# Patient Record
Sex: Male | Born: 1970 | Race: Black or African American | Hispanic: No | Marital: Married | Smoking: Never smoker
Health system: Southern US, Community
[De-identification: ages and names within clinical notes are randomized; demographics above are authoritative.]

## PROBLEM LIST (undated history)

## (undated) DIAGNOSIS — R519 Headache, unspecified: Secondary | ICD-10-CM

## (undated) DIAGNOSIS — R51 Headache: Secondary | ICD-10-CM

## (undated) HISTORY — PX: EYE SURGERY: SHX253

---

## 2006-09-13 ENCOUNTER — Emergency Department (HOSPITAL_COMMUNITY): Admission: EM | Admit: 2006-09-13 | Discharge: 2006-09-13 | Payer: Self-pay | Admitting: Emergency Medicine

## 2009-04-26 ENCOUNTER — Emergency Department (HOSPITAL_COMMUNITY): Admission: EM | Admit: 2009-04-26 | Discharge: 2009-04-27 | Payer: Self-pay | Admitting: Emergency Medicine

## 2010-09-21 LAB — DIFFERENTIAL
Basophils Absolute: 0.1 10*3/uL (ref 0.0–0.1)
Basophils Relative: 1 % (ref 0–1)
Eosinophils Absolute: 2.3 10*3/uL — ABNORMAL HIGH (ref 0.0–0.7)
Monocytes Relative: 5 % (ref 3–12)
Neutrophils Relative %: 34 % — ABNORMAL LOW (ref 43–77)

## 2010-09-21 LAB — CBC
MCHC: 34 g/dL (ref 30.0–36.0)
MCV: 91 fL (ref 78.0–100.0)
RBC: 4.09 MIL/uL — ABNORMAL LOW (ref 4.22–5.81)
RDW: 13.3 % (ref 11.5–15.5)

## 2017-01-06 ENCOUNTER — Emergency Department (HOSPITAL_COMMUNITY)
Admission: EM | Admit: 2017-01-06 | Discharge: 2017-01-06 | Disposition: A | Discharge status: Home / Self Care | Payer: Self-pay | Attending: Emergency Medicine | Admitting: Emergency Medicine

## 2017-01-06 ENCOUNTER — Encounter (HOSPITAL_COMMUNITY): Payer: Self-pay

## 2017-01-06 DIAGNOSIS — M2550 Pain in unspecified joint: Secondary | ICD-10-CM

## 2017-01-06 DIAGNOSIS — M130 Polyarthritis, unspecified: Secondary | ICD-10-CM | POA: Insufficient documentation

## 2017-01-06 MED ORDER — PREDNISONE 20 MG PO TABS: ORAL_TABLET | ORAL | 0 refills | Status: DC

## 2017-01-06 MED ORDER — KETOROLAC TROMETHAMINE 30 MG/ML IJ SOLN
30.0000 mg | Freq: Once | INTRAMUSCULAR | Status: AC
  Administered 2017-01-06: 30 mg via INTRAMUSCULAR
  Filled 2017-01-06: qty 1

## 2017-01-06 MED ORDER — COLCHICINE 0.6 MG PO TABS: 0.6000 mg | ORAL_TABLET | Freq: Every day | ORAL | 0 refills | Status: DC

## 2017-01-06 NOTE — Discharge Instructions (Signed)
Please take medications prescribed along with Naproxen for your pain.  Follow up with your primary care provider or orthopedist if your conditions are not well controlled.

## 2017-01-06 NOTE — ED Triage Notes (Signed)
Patient complains of bilateral knee pain with ambulation and swelling to right wrist x 3 days. States seen in [place] and taking naprosyn as prescribed with some relief, denies injury, NAD

## 2017-01-06 NOTE — ED Notes (Signed)
Pt here for c/o right knee pain,saw MD in [place] this week. Started last pm with "knots" on right leg and right wrist.

## 2017-01-06 NOTE — ED Provider Notes (Signed)
[facility]-EMERGENCY DEPT Provider Note   CSN: [id number] Arrival date & time: 01/06/17  1231     History   Chief Complaint Chief Complaint  Patient presents with  . knee pain/wrist swelling    HPI Bryan Reilly is a 46 y.o. male.  HPI   46 year old male presenting complaining of multiple joint pain. Patient states for the past 2 weeks he has had intermittent pain to his right knee primarily to the back the knee and radiates towards his right calf. Pain is described as a dull sensation more noticeable at nighttime when he rest. Pain is mild to moderate. He went to a local ER 2 days ago for this complaint. He had an x-ray of his right knee and was told that he may need an MRI for further evaluation because the x-ray did not show anything. He was prescribed naproxen. he took naproxen which provide some relief. However, since yesterday he now noticing similar pain to his left knee, and right wrist. Pain worsening with movement. He felt the area is swollen as well. He denies fever, chills, vision changes, dysuria, penile discharge, or rash. He denies any recent strenuous activities or injury. No history of gout. No history of autoimmune disease. No history of cancer. No prior injury to the same area. No history of STD.  History reviewed. No pertinent past medical history.  There are no active problems to display for this patient.   History reviewed. No pertinent surgical history.     Home Medications    Prior to Admission medications   Not on File    Family History No family history on file.  Social History Social History  Substance Use Topics  . Smoking status: Never Smoker  . Smokeless tobacco: Never Used  . Alcohol use Not on file     Allergies   Patient has no known allergies.   Review of Systems Review of Systems  Constitutional: Negative for fever.  Musculoskeletal: Positive for arthralgias.  Skin: Negative for rash and wound.  Neurological: Negative for  numbness.     Physical Exam Updated Vital Signs BP 121/71 (BP Location: Left Arm)   Pulse 80   Temp 98.3 F (36.8 C) (Oral)   Resp 18   SpO2 99%   Physical Exam  Constitutional: He appears well-developed and well-nourished. No distress.  Patient is well-appearing no acute discomfort  HENT:  Head: Atraumatic.  Eyes: Conjunctivae are normal.  Neck: Neck supple.  Cardiovascular: Intact distal pulses.   Musculoskeletal: He exhibits tenderness (Right wrist: Tenderness to all aspects with decreased wrist range of motion but normal appearance. Right knee tenderness to posterior knee with normal knee flexion extension. Left knee: Tenderness to anterior knee with decreased flexion).  Neurological: He is alert.  Patellar deep tendon reflex intact bilaterally, no foot drops, 5 out 5 strength to bilateral lower extremities  Skin: No rash noted.  Psychiatric: He has a normal mood and affect.  Nursing note and vitals reviewed.    ED Treatments / Results  Labs (all labs ordered are listed, but only abnormal results are displayed) Labs Reviewed - No data to display  EKG  EKG Interpretation None       Radiology No results found.  Procedures Procedures (including critical care time)  Medications Ordered in ED Medications - No data to display   Initial Impression / Assessment and Plan / ED Course  I have reviewed the triage vital signs and the nursing notes.  Pertinent labs & imaging results that  were available during my care of the patient were reviewed by me and considered in my medical decision making (see chart for details).     BP 121/71 (BP Location: Left Arm)   Pulse 80   Temp 98.3 F (36.8 C) (Oral)   Resp 18   SpO2 99%    Final Clinical Impressions(s) / ED Diagnoses   Final diagnoses:  Polyarthralgia    New Prescriptions New Prescriptions   COLCHICINE 0.6 MG TABLET    Take 1 tablet (0.6 mg total) by mouth daily.   PREDNISONE (DELTASONE) 20 MG TABLET     3 tabs po day one, then 2 tabs daily x 4 days   1:10 PM Patient here with polyarthralgias involving right wrist, bilateral knees.  On exam, patient does not appear to have any significant signs of injury warranting x-ray. No surrounding skin erythematous yes cellulitis. Low suspicion for septic joint. I have low suspicion for disseminated gonococcal arthralgia. Question gout versus rheumatological condition. Doubt DVT. Able to cannulate without difficulty, he is neurovascularly intact. At this time, I will prescribe patient prednisone and Colchicine. Ortho referral given.  Return precaution discussed.     Fayrene Helper, PA-C 01/06/17 1314    Arby Barrette, MD 01/06/17 [phone]

## 2017-01-09 ENCOUNTER — Ambulatory Visit
Admission: RE | Admit: 2017-01-09 | Discharge: 2017-01-09 | Disposition: A | Discharge status: Home / Self Care | Payer: PRIVATE HEALTH INSURANCE | Source: Ambulatory Visit | Attending: Family Medicine | Admitting: Family Medicine

## 2017-01-09 ENCOUNTER — Other Ambulatory Visit: Payer: Self-pay | Admitting: Family Medicine

## 2017-01-09 DIAGNOSIS — M25532 Pain in left wrist: Secondary | ICD-10-CM

## 2017-01-09 DIAGNOSIS — M25562 Pain in left knee: Secondary | ICD-10-CM

## 2017-03-05 ENCOUNTER — Other Ambulatory Visit: Payer: Self-pay | Admitting: Sports Medicine

## 2017-03-05 DIAGNOSIS — M25512 Pain in left shoulder: Secondary | ICD-10-CM

## 2017-03-16 ENCOUNTER — Ambulatory Visit
Admission: RE | Admit: 2017-03-16 | Discharge: 2017-03-16 | Disposition: A | Discharge status: Home / Self Care | Payer: PRIVATE HEALTH INSURANCE | Source: Ambulatory Visit | Attending: Sports Medicine | Admitting: Sports Medicine

## 2017-03-16 DIAGNOSIS — M25512 Pain in left shoulder: Secondary | ICD-10-CM

## 2017-08-26 DIAGNOSIS — J0101 Acute recurrent maxillary sinusitis: Secondary | ICD-10-CM | POA: Diagnosis not present

## 2017-08-26 DIAGNOSIS — J339 Nasal polyp, unspecified: Secondary | ICD-10-CM | POA: Diagnosis not present

## 2017-08-26 DIAGNOSIS — R51 Headache: Secondary | ICD-10-CM | POA: Diagnosis present

## 2017-08-27 ENCOUNTER — Emergency Department (HOSPITAL_COMMUNITY): Payer: PRIVATE HEALTH INSURANCE

## 2017-08-27 ENCOUNTER — Encounter (HOSPITAL_COMMUNITY): Payer: Self-pay

## 2017-08-27 ENCOUNTER — Emergency Department (HOSPITAL_COMMUNITY)
Admission: EM | Admit: 2017-08-27 | Discharge: 2017-08-27 | Disposition: A | Discharge status: Home / Self Care | Payer: PRIVATE HEALTH INSURANCE | Attending: Emergency Medicine | Admitting: Emergency Medicine

## 2017-08-27 DIAGNOSIS — J0101 Acute recurrent maxillary sinusitis: Secondary | ICD-10-CM

## 2017-08-27 DIAGNOSIS — J339 Nasal polyp, unspecified: Secondary | ICD-10-CM

## 2017-08-27 MED ORDER — MOXIFLOXACIN HCL 400 MG PO TABS: 400.0000 mg | ORAL_TABLET | Freq: Every day | ORAL | 0 refills | Status: DC

## 2017-08-27 MED ORDER — ACETAMINOPHEN 500 MG PO TABS
1000.0000 mg | ORAL_TABLET | Freq: Once | ORAL | Status: AC
  Administered 2017-08-27: 1000 mg via ORAL
  Filled 2017-08-27: qty 2

## 2017-08-27 MED ORDER — HYDROCODONE-ACETAMINOPHEN 5-325 MG PO TABS: ORAL_TABLET | ORAL | 0 refills | Status: DC

## 2017-08-27 NOTE — Discharge Instructions (Signed)
You can use Flonase nasal spray which is avilable over the counter. Use nasal saline (you can try Arm and Hammer Simply Saline) at least 4 times a day, use saline 5-10 minutes before using the fluticasone (flonase) nasal spray ° °Do not use Afrin (Oxymetazoline) ° °Rest, wash hands frequently  and drink plenty of water. ° °You may try over the counter medication such as allegra-decongestant or claritin-decongestant and/or Mucinex or decongestants. ° °Push fluids to try to thin the mucus and get it to flow out the nose.  ° °Please follow with your primary care doctor in the next 2 days for a check-up. They must obtain records for further management.  ° °Do not hesitate to return to the Emergency Department for any new, worsening or concerning symptoms.  ° °

## 2017-08-27 NOTE — ED Notes (Signed)
Patient transported to CT 

## 2017-08-27 NOTE — ED Triage Notes (Signed)
Pt complains of recurrent sinus infections, he has had antibiotics and nasal sprays, he now complains of pain behind his eyes and through his head, he says its worse at night

## 2017-08-27 NOTE — ED Provider Notes (Signed)
[facility]-EMERGENCY DEPT Provider Note   CSN: [id number] Arrival date & time: 08/26/17  2355     History   Chief Complaint Chief Complaint  Patient presents with  . Recurrent Sinusitis    HPI   Blood pressure 126/82, pulse 60, temperature 97.9 F (36.6 C), temperature source Oral, resp. rate 20, SpO2 99 %.  Bryan Reilly is a 47 y.o. male complaining of right temporal pain worsening over the course of the last several days.  He states he is being treated for a sinusitis with amoxicillin he is finished 8 of the 10 days.  He states he has been on 3 courses of antibiotics in the last 4 months.  Afebrile but initially before starting antibiotics he states that he did have fever he denies any change in vision, nausea, vomiting, numbness, weakness.  He is taking ibuprofen at home with little relief.  History reviewed. No pertinent past medical history.  There are no active problems to display for this patient.   History reviewed. No pertinent surgical history.     Home Medications    Prior to Admission medications   Medication Sig Start Date End Date Taking? Authorizing Provider  colchicine 0.6 MG tablet Take 1 tablet (0.6 mg total) by mouth daily. 01/06/17   Fayrene Helperran, [place], PA-C  HYDROcodone-acetaminophen (NORCO/VICODIN) 5-325 MG tablet Take 1-2 tablets by mouth every 6 hours as needed for pain. 08/27/17   Rein Popov, Joni ReiningNicole, PA-C  moxifloxacin (AVELOX) 400 MG tablet Take 1 tablet (400 mg total) by mouth daily. 08/27/17   Jordyn Doane, Joni ReiningNicole, PA-C  predniSONE (DELTASONE) 20 MG tablet 3 tabs po day one, then 2 tabs daily x 4 days 01/06/17   Fayrene Helperran, [place], PA-C    Family History History reviewed. No pertinent family history.  Social History Social History   Tobacco Use  . Smoking status: Never Smoker  . Smokeless tobacco: Never Used  Substance Use Topics  . Alcohol use: No    Frequency: Never  . Drug use: No     Allergies   Patient has no known  allergies.   Review of Systems Review of Systems  A complete review of systems was obtained and all systems are negative except as noted in the HPI and PMH.    Physical Exam Updated Vital Signs BP 134/88 (BP Location: Right Arm)   Pulse 64   Temp 97.9 F (36.6 C) (Oral)   Resp 15   SpO2 98%   Physical Exam  Constitutional: He appears well-developed and well-nourished.  HENT:  Head: Normocephalic.  Right Ear: External ear normal.  Left Ear: External ear normal.  Mouth/Throat: Oropharynx is clear and moist. No oropharyngeal exudate.  No drooling or stridor. Posterior pharynx mildly erythematous no significant tonsillar hypertrophy. No exudate. Soft palate rises symmetrically. No TTP or induration under tongue.   No tenderness to palpation of frontal or bilateral maxillary sinuses.  Mild mucosal edema in the nares with scant rhinorrhea.  Bilateral tympanic membranes with normal architecture and good light reflex.    Eyes: Conjunctivae and EOM are normal. Pupils are equal, round, and reactive to light.  Neck: Normal range of motion. Neck supple.  Cardiovascular: Normal rate and regular rhythm.  Pulmonary/Chest: Effort normal and breath sounds normal. No stridor. No respiratory distress. He has no wheezes. He has no rales. He exhibits no tenderness.  Abdominal: Soft. There is no tenderness. There is no rebound and no guarding.  Nursing note and vitals reviewed.    ED Treatments /  Results  Labs (all labs ordered are listed, but only abnormal results are displayed) Labs Reviewed - No data to display  EKG  EKG Interpretation None       Radiology Ct Head Wo Contrast  Result Date: 08/27/2017 CLINICAL DATA:  Acute onset of pain behind the eyes. Headache. Recurrent sinus infection. EXAM: CT HEAD WITHOUT CONTRAST TECHNIQUE: Contiguous axial images were obtained from the base of the skull through the vertex without intravenous contrast. COMPARISON:  None. FINDINGS: Brain:  No evidence of acute infarction, hemorrhage, hydrocephalus, extra-axial collection or mass lesion/mass effect. The posterior fossa, including the cerebellum, brainstem and fourth ventricle, is within normal limits. The third and lateral ventricles, and basal ganglia are unremarkable in appearance. The cerebral hemispheres are symmetric in appearance, with normal gray-white differentiation. No mass effect or midline shift is seen. Vascular: No hyperdense vessel or unexpected calcification. Skull: There is no evidence of fracture; visualized osseous structures are unremarkable in appearance. Sinuses/Orbits: The visualized portions of the orbits are within normal limits. There is complete opacification of the left maxillary sinus, left frontal sinus and left ethmoid air cells. A large 5 cm polyp or mucus retention cyst is noted extending through the medial wall of the left maxillary sinus. There is mucoperiosteal thickening at the left maxillary sinus. The remaining paranasal sinuses and mastoid air cells are well-aerated. Other: No significant soft tissue abnormalities are seen. IMPRESSION: 1. No acute intracranial pathology seen on CT. 2. Large 5 cm polyp or mucus retention cyst noted extending through the medial wall of the left maxillary sinus, with associated mucoperiosteal thickening. Complete opacification of the left maxillary sinus, left frontal sinus and left ethmoid air cells. Electronically Signed   By: Roanna Raider M.D.   On: 08/27/2017 03:25    Procedures Procedures (including critical care time)  Medications Ordered in ED Medications  acetaminophen (TYLENOL) tablet 1,000 mg (1,000 mg Oral Given 08/27/17 0246)     Initial Impression / Assessment and Plan / ED Course  I have reviewed the triage vital signs and the nursing notes.  Pertinent labs & imaging results that were available during my care of the patient were reviewed by me and considered in my medical decision making (see chart for  details).     Vitals:   08/27/17 0031 08/27/17 0404  BP: 126/82 134/88  Pulse: 60 64  Resp: 20 15  Temp: 97.9 F (36.6 C)   TempSrc: Oral   SpO2: 99% 98%    Medications  acetaminophen (TYLENOL) tablet 1,000 mg (1,000 mg Oral Given 08/27/17 0246)    Bryan Reilly is 47 y.o. male presenting with recurrent sinusitis.  CT with polyp and opacification of the maxillary sinus, I am taking him off of the amoxicillin and will start him on Levaquin, ENT referral given as an outpatient, Vicodin for pain control at home.  Evaluation does not show pathology that would require ongoing emergent intervention or inpatient treatment. Pt is hemodynamically stable and mentating appropriately. Discussed findings and plan with patient/guardian, who agrees with care plan. All questions answered. Return precautions discussed and outpatient follow up given.      Final Clinical Impressions(s) / ED Diagnoses   Final diagnoses:  Nasal polyp  Acute recurrent maxillary sinusitis    ED Discharge Orders        Ordered    moxifloxacin (AVELOX) 400 MG tablet  Daily     08/27/17 0359    HYDROcodone-acetaminophen (NORCO/VICODIN) 5-325 MG tablet     08/27/17 0359  Kaylyn Lim 08/27/17 0411    Molpus, Jonny Ruiz, MD 08/27/17 [phone]

## 2017-10-08 ENCOUNTER — Ambulatory Visit (INDEPENDENT_AMBULATORY_CARE_PROVIDER_SITE_OTHER): Payer: PRIVATE HEALTH INSURANCE | Admitting: Otolaryngology

## 2017-10-08 DIAGNOSIS — J322 Chronic ethmoidal sinusitis: Secondary | ICD-10-CM

## 2017-10-08 DIAGNOSIS — J310 Chronic rhinitis: Secondary | ICD-10-CM | POA: Diagnosis not present

## 2017-10-08 DIAGNOSIS — J320 Chronic maxillary sinusitis: Secondary | ICD-10-CM | POA: Diagnosis not present

## 2017-10-18 ENCOUNTER — Other Ambulatory Visit (INDEPENDENT_AMBULATORY_CARE_PROVIDER_SITE_OTHER): Payer: Self-pay | Admitting: Otolaryngology

## 2017-10-18 DIAGNOSIS — J329 Chronic sinusitis, unspecified: Secondary | ICD-10-CM

## 2017-10-24 ENCOUNTER — Ambulatory Visit (HOSPITAL_COMMUNITY)
Admission: RE | Admit: 2017-10-24 | Discharge: 2017-10-24 | Disposition: A | Discharge status: Home / Self Care | Payer: PRIVATE HEALTH INSURANCE | Source: Ambulatory Visit | Attending: Otolaryngology | Admitting: Otolaryngology

## 2017-10-24 DIAGNOSIS — J329 Chronic sinusitis, unspecified: Secondary | ICD-10-CM

## 2017-10-24 DIAGNOSIS — J320 Chronic maxillary sinusitis: Secondary | ICD-10-CM | POA: Diagnosis present

## 2017-10-27 ENCOUNTER — Encounter (HOSPITAL_COMMUNITY): Payer: Self-pay | Admitting: Emergency Medicine

## 2017-10-27 ENCOUNTER — Emergency Department (HOSPITAL_COMMUNITY): Payer: PRIVATE HEALTH INSURANCE

## 2017-10-27 ENCOUNTER — Other Ambulatory Visit: Payer: Self-pay

## 2017-10-27 ENCOUNTER — Emergency Department (HOSPITAL_COMMUNITY)
Admission: EM | Admit: 2017-10-27 | Discharge: 2017-10-27 | Disposition: A | Discharge status: Home / Self Care | Payer: PRIVATE HEALTH INSURANCE | Attending: Emergency Medicine | Admitting: Emergency Medicine

## 2017-10-27 DIAGNOSIS — R079 Chest pain, unspecified: Secondary | ICD-10-CM | POA: Diagnosis present

## 2017-10-27 LAB — BASIC METABOLIC PANEL
ANION GAP: 8 (ref 5–15)
BUN: 17 mg/dL (ref 6–20)
CHLORIDE: 108 mmol/L (ref 101–111)
CO2: 26 mmol/L (ref 22–32)
Calcium: 8.4 mg/dL — ABNORMAL LOW (ref 8.9–10.3)
Creatinine, Ser: 1.13 mg/dL (ref 0.61–1.24)
GFR calc non Af Amer: >60 mL/min (ref >60)
GLUCOSE: 96 mg/dL (ref 65–99)
POTASSIUM: 4.1 mmol/L (ref 3.5–5.1)
SODIUM: 142 mmol/L (ref 135–145)

## 2017-10-27 LAB — CBC
HEMATOCRIT: 40.3 % (ref 39.0–52.0)
Hemoglobin: 12.4 g/dL — ABNORMAL LOW (ref 13.0–17.0)
MCH: 28.7 pg (ref 26.0–34.0)
MCHC: 30.8 g/dL (ref 30.0–36.0)
MCV: 93.3 fL (ref 78.0–100.0)
Platelets: 178 10*3/uL (ref 150–400)
RBC: 4.32 MIL/uL (ref 4.22–5.81)
RDW: 13.4 % (ref 11.5–15.5)
WBC: 4.3 10*3/uL (ref 4.0–10.5)

## 2017-10-27 LAB — D-DIMER, QUANTITATIVE (NOT AT ARMC): D DIMER QUANT: 0.33 ug{FEU}/mL (ref 0.00–0.50)

## 2017-10-27 LAB — I-STAT TROPONIN, ED: Troponin i, poc: 0 ng/mL (ref 0.00–0.08)

## 2017-10-27 MED ORDER — KETOROLAC TROMETHAMINE 30 MG/ML IJ SOLN
30.0000 mg | Freq: Once | INTRAMUSCULAR | Status: AC
  Administered 2017-10-27: 30 mg via INTRAVENOUS
  Filled 2017-10-27: qty 1

## 2017-10-27 NOTE — ED Notes (Signed)
Blue top sent to the lab to hold.

## 2017-10-27 NOTE — ED Provider Notes (Signed)
Emergency Department Provider Note   I have reviewed the triage vital signs and the nursing notes.   HISTORY  Chief Complaint Chest Pain   HPI Bryan Reilly is a 47 y.o. male without significant past medical history the presents the emergency department today secondary to chest pain.  Patient states that Thursday he started feeling some left-sided sharp chest pain but this slowly improved.  Throughout Friday he started having pain was more central and today is more on the right side.  States is worse when he takes a deep breath and if he coughs.  He had some cough yesterday but was not productive.  No fevers or chills.  No leg swelling, recent surgeries or long car rides.  No history of blood clots.  No history of cardiac issues.  Does not smoke cigarettes.  No sick contacts.  Does have a history of rheumatoid arthritis for which he takes ibuprofen, turmeric and folic acid. No other associated or modifying symptoms.    History reviewed. No pertinent past medical history.  There are no active problems to display for this patient.   History reviewed. No pertinent surgical history.  Current Outpatient Rx  . Order #: [id number] Class: Historical Med  . Order #: [id number] Class: Historical Med  . Order #: [id number] Class: Historical Med  . Order #: [id number] Class: Historical Med    Allergies Patient has no known allergies.  History reviewed. No pertinent family history.  Social History Social History   Tobacco Use  . Smoking status: Never Smoker  . Smokeless tobacco: Never Used  Substance Use Topics  . Alcohol use: No    Frequency: Never  . Drug use: No    Review of Systems  All other systems negative except as documented in the HPI. All pertinent positives and negatives as reviewed in the HPI. ____________________________________________   PHYSICAL EXAM:  VITAL SIGNS: ED Triage Vitals  Enc Vitals Group     BP 10/27/17 0631 (!) 142/72     Pulse Rate 10/27/17 0631  60     Resp 10/27/17 0631 10     Temp 10/27/17 0631 97.8 F (36.6 C)     Temp Source 10/27/17 0631 Oral     SpO2 10/27/17 0631 100 %     Weight 10/27/17 0632 195 lb (88.5 kg)     Height 10/27/17 0632  (1.93 m)    Constitutional: Alert and oriented. Well appearing and in no acute distress. Eyes: Conjunctivae are normal. PERRL. EOMI. Head: Atraumatic. Nose: No congestion/rhinnorhea. Mouth/Throat: Mucous membranes are moist.  Oropharynx non-erythematous. Neck: No stridor.  No meningeal signs.   Cardiovascular: Normal rate, regular rhythm. Good peripheral circulation. Grossly normal heart sounds.   Respiratory: Normal respiratory effort.  No retractions. Lungs CTAB. Gastrointestinal: Soft and nontender. No distention.  Musculoskeletal: No lower extremity tenderness nor edema. No gross deformities of extremities. Neurologic:  Normal speech and language. No gross focal neurologic deficits are appreciated.  Skin:  Skin is warm, dry and intact. No rash noted.   ____________________________________________   LABS (all labs ordered are listed, but only abnormal results are displayed)  Labs Reviewed  BASIC METABOLIC PANEL - Abnormal; Notable for the following components:      Result Value   Calcium 8.4 (*)    All other components within normal limits  CBC - Abnormal; Notable for the following components:   Hemoglobin 12.4 (*)    All other components within normal limits  D-DIMER, QUANTITATIVE (NOT AT [facility])  I-STAT TROPONIN, ED  ____________________________________________  EKG   EKG Interpretation  Date/Time:  Saturday Oct 27 2017 06:29:43 EDT Ventricular Rate:  56 PR Interval:    QRS Duration: 109 QT Interval:  405 QTC Calculation: 391 R Axis:   -30 Text Interpretation:  Sinus rhythm Atrial premature complexes Left axis deviation Abnormal R-wave progression, early transition Minimal ST elevation, anterior leads ST changes similar to previous in march 2008 Confirmed by  Marily Memos [phone]) on 10/27/2017 8:01:37 AM       ____________________________________________  RADIOLOGY  Dg Chest 2 View  Result Date: 10/27/2017 CLINICAL DATA:  Right-sided chest pain today with shortness of breath. EXAM: CHEST - 2 VIEW COMPARISON:  09/12/2016 FINDINGS: Heart size is normal. Mediastinal shadows are normal. The lungs are clear. No bronchial thickening. No infiltrate, mass, effusion or collapse. Pulmonary vascularity is normal. No bony abnormality. IMPRESSION: Normal chest Electronically Signed   By: Paulina Fusi M.D.   On: 10/27/2017 07:10    ____________________________________________   INITIAL IMPRESSION / ASSESSMENT AND PLAN / ED COURSE  Atypical story for ACS, doubt that. Possibly PE? Will check d dimer. ecg ok, 3 days of symptoms, negative troponin, unlikely primary cardiac. Cough yesterday, but nonproductive, no fever or chills and clear CXR, doubt pneumonia.  Possibly MSK? Will treat with NSAIDs pending labs.   Labs unremarkable.  D-dimer low making pulmonary embolus unlikely.  Suspect likely costochondritis as a cause of his symptoms.  Will treat with NSAIDs and PCP follow-up as needed.   Pertinent labs & imaging results that were available during my care of the patient were reviewed by me and considered in my medical decision making (see chart for details).  ____________________________________________  FINAL CLINICAL IMPRESSION(S) / ED DIAGNOSES  Final diagnoses:  Nonspecific chest pain     MEDICATIONS GIVEN DURING THIS VISIT:  Medications  ketorolac (TORADOL) 30 MG/ML injection 30 mg (30 mg Intravenous Given 10/27/17 0847)     NEW OUTPATIENT MEDICATIONS STARTED DURING THIS VISIT:  Current Discharge Medication List      Note:  This note was prepared with assistance of Dragon voice recognition software. Occasional wrong-word or sound-a-like substitutions may have occurred due to the inherent limitations of voice recognition software.     Marily Memos, MD 10/27/17 1051

## 2017-10-27 NOTE — ED Triage Notes (Signed)
Patient complaining of right chest pain today. Patient states it started this morning. Patient states he is sob and tired.

## 2017-10-27 NOTE — ED Notes (Signed)
Patient transported to X-ray 

## 2017-10-29 ENCOUNTER — Ambulatory Visit (INDEPENDENT_AMBULATORY_CARE_PROVIDER_SITE_OTHER): Payer: PRIVATE HEALTH INSURANCE | Admitting: Otolaryngology

## 2017-10-29 DIAGNOSIS — J321 Chronic frontal sinusitis: Secondary | ICD-10-CM

## 2017-10-29 DIAGNOSIS — J320 Chronic maxillary sinusitis: Secondary | ICD-10-CM

## 2017-10-29 DIAGNOSIS — J322 Chronic ethmoidal sinusitis: Secondary | ICD-10-CM

## 2017-11-06 ENCOUNTER — Ambulatory Visit (HOSPITAL_COMMUNITY): Payer: PRIVATE HEALTH INSURANCE

## 2017-11-30 ENCOUNTER — Ambulatory Visit (INDEPENDENT_AMBULATORY_CARE_PROVIDER_SITE_OTHER): Payer: PRIVATE HEALTH INSURANCE | Admitting: Neurology

## 2017-11-30 ENCOUNTER — Other Ambulatory Visit: Payer: Self-pay

## 2017-11-30 ENCOUNTER — Encounter: Payer: Self-pay | Admitting: Neurology

## 2017-11-30 VITALS — BP 106/60 | HR 83 | Ht 76.0 in | Wt 181.0 lb

## 2017-11-30 DIAGNOSIS — R51 Headache: Secondary | ICD-10-CM | POA: Diagnosis not present

## 2017-11-30 DIAGNOSIS — R202 Paresthesia of skin: Secondary | ICD-10-CM

## 2017-11-30 DIAGNOSIS — R519 Headache, unspecified: Secondary | ICD-10-CM

## 2017-11-30 MED ORDER — NORTRIPTYLINE HCL 25 MG PO CAPS: 25.0000 mg | ORAL_CAPSULE | Freq: Every day | ORAL | 3 refills | Status: DC

## 2017-11-30 NOTE — Patient Instructions (Addendum)
  1.  Start nortriptyline 25mg  at bedtime.  Call in 4 weeks with update and we can adjust dose if needed. 2.  Stop butalbital.  Instead, take Excedrin Migraine. 3.  Limit use of pain relievers to no more than 2 days out of the week.  These medications include acetaminophen, ibuprofen, triptans and narcotics.  This will help reduce risk of rebound headaches. 4.  Be aware of common food triggers such as processed sweets, processed foods with nitrites (such as deli meat, hot dogs, sausages), foods with MSG, alcohol (such as wine), chocolate, certain cheeses, certain fruits (dried fruits, bananas, some citrus fruit), vinegar, diet soda. 4.  Avoid caffeine 5.  Routine exercise 6.  Proper sleep hygiene 7.  Stay adequately hydrated with water 8.  Keep a headache diary. 9.  Maintain proper stress management. 10.  Do not skip meals. 11.  Consider supplements:  Magnesium citrate 400mg  to 600mg  daily, riboflavin 400mg , Coenzyme Q 10 100mg  three times daily 12.  We will check MRI of brain with and without contrast. We have [facility] and they will call you directly to schedule your appt. They are located at [address] If you need to contact them directly please call [phone]. 13.  Follow up in 3 months.

## 2017-11-30 NOTE — Progress Notes (Signed)
NEUROLOGY CONSULTATION NOTE  Bryan Reilly MRN: [id number] DOB: 21-May-1971  Referring provider: Dr. Parke Simmers Primary care provider: Dr. Parke Simmers  Reason for consult:  headache  HISTORY OF PRESENT ILLNESS: Bryan Reilly is a 47 year old male who presents for headache and dysesthesias.  For about a year, he would experience episodes of burning discomfort involving his entire head and traveling down his neck and entire spine.  It would last for hours and occur 2 to 3 times a week.  About 3 months ago, he developed a new headache.  It is a severe holocephalic pressure-like headache that is associated with nausea and blurred vision but not vomiting, photophobia, phonophobia, autonomic symptoms or unilateral numbness or weakness.  The dysesthesias now occur with the headaches.  They last 2 hours and occur every other day.  Sitting still makes it worse.  Moving around helps relieve it.  He has taken Fioricet with variable results.  CT of head from 08/27/17 was personally reviewed and was unremarkable.  CT of maxillary sinuses demonstrated large 5 cm polyp or retention cyst extending through medial wall of left maxillary sinus with mucoperiosteal thickening.  Sinusitis was noted in the left maxillary sinus, left frontal sinus and left ethmoid air cells.  In addition to recent sinusitis, he reports periodontal infection.  PAST MEDICAL HISTORY: No past medical history on file.  PAST SURGICAL HISTORY: No past surgical history on file.  MEDICATIONS: Current Outpatient Medications on File Prior to Visit  Medication Sig Dispense Refill  . fluticasone (FLONASE) 50 MCG/ACT nasal spray Place 2 sprays into both nostrils daily.    . folic acid (FOLVITE) 1 MG tablet Take 1 mg by mouth daily.  2  . ibuprofen (ADVIL,MOTRIN) 800 MG tablet Take 800 mg by mouth 3 (three) times daily as needed for moderate pain.   1  . Turmeric Curcumin 500 MG CAPS Take 500 mg by mouth daily.     No current  facility-administered medications on file prior to visit.     ALLERGIES: No Known Allergies  FAMILY HISTORY: No family history on file.  SOCIAL HISTORY: Social History   Socioeconomic History  . Marital status: Married    Spouse name: Not on file  . Number of children: Not on file  . Years of education: Not on file  . Highest education level: Not on file  Occupational History  . Not on file  Social Needs  . Financial resource strain: Not on file  . Food insecurity:    Worry: Not on file    Inability: Not on file  . Transportation needs:    Medical: Not on file    Non-medical: Not on file  Tobacco Use  . Smoking status: Never Smoker  . Smokeless tobacco: Never Used  Substance and Sexual Activity  . Alcohol use: No    Frequency: Never  . Drug use: No  . Sexual activity: Not on file  Lifestyle  . Physical activity:    Days per week: Not on file    Minutes per session: Not on file  . Stress: Not on file  Relationships  . Social connections:    Talks on phone: Not on file    Gets together: Not on file    Attends religious service: Not on file    Active member of club or organization: Not on file    Attends meetings of clubs or organizations: Not on file    Relationship status: Not on file  . Intimate partner violence:  Fear of current or ex partner: Not on file    Emotionally abused: Not on file    Physically abused: Not on file    Forced sexual activity: Not on file  Other Topics Concern  . Not on file  Social History Narrative   Pt lives in 2 story home with his wife, 2 step children and his wife's grandmother   Has 2 children of his own   12th grade education   Worked for [employer]    REVIEW OF SYSTEMS: Constitutional: No fevers, chills, or sweats, no generalized fatigue, change in appetite Eyes: No visual changes, double vision, eye pain Ear, nose and throat: No hearing loss, ear pain, nasal congestion, sore throat Cardiovascular: No chest pain,  palpitations Respiratory:  No shortness of breath at rest or with exertion, wheezes GastrointestinaI: No nausea, vomiting, diarrhea, abdominal pain, fecal incontinence Genitourinary:  No dysuria, urinary retention or frequency Musculoskeletal:  No neck pain, back pain Integumentary: No rash, pruritus, skin lesions Neurological: as above Psychiatric: No depression, insomnia, anxiety Endocrine: No palpitations, fatigue, diaphoresis, mood swings, change in appetite, change in weight, increased thirst Hematologic/Lymphatic:  No purpura, petechiae. Allergic/Immunologic: no itchy/runny eyes, nasal congestion, recent allergic reactions, rashes  PHYSICAL EXAM: Vitals:   11/30/17 1352  BP: 106/60  Pulse: 83  SpO2: 98%   General: No acute distress.  Patient appears well-groomed.  Head:  Normocephalic/atraumatic Eyes:  fundi examined but not visualized Neck: supple, no paraspinal tenderness, full range of motion Back: No paraspinal tenderness Heart: regular rate and rhythm Lungs: Clear to auscultation bilaterally. Vascular: No carotid bruits. Neurological Exam: Mental status: alert and oriented to person, place, and time, recent and remote memory intact, fund of knowledge intact, attention and concentration intact, speech fluent and not dysarthric, language intact. Cranial nerves: CN I: not tested CN II: pupils equal, round and reactive to light, visual fields intact CN III, IV, VI:  full range of motion, no nystagmus, no ptosis CN V: facial sensation intact CN VII: upper and lower face symmetric CN VIII: hearing intact CN IX, X: gag intact, uvula midline CN XI: sternocleidomastoid and trapezius muscles intact CN XII: tongue midline Bulk & Tone: normal, no fasciculations. Motor:  5/5 throughout  Sensation: temperature and vibration sensation intact. Deep Tendon Reflexes:  2+ throughout, toes downgoing.  Finger to nose testing:  Without dysmetria.  Heel to shin:  Without dysmetria.    Gait:  Normal station and stride.  Able to turn and tandem walk. Romberg negative.  IMPRESSION: New-onset headaches, possibly migraine without aura, not intractable Dysesthesias.  Possibly associated symptom of migraine  PLAN: 1.  Given that these are new headaches without explanation for the sensory symptoms, we will get MRI of brain with and without contrast 2.  Nortriptyline 25mg  at bedtime.  May increase dose in 4 weeks if needed 3.  Stop Fioricet.  May use Excedrin but limited to no more than 2 days out of week 4.  Follow up in 3 months.  Thank you for allowing me to take part in the care of this patient.  40 minutes spent face to face with patient, over 50% spent discussing management.  Shon MilletAdam Timberly Yott, DO  CC:  Jackquline DenmarkVieta Bland, MD

## 2017-12-03 ENCOUNTER — Ambulatory Visit
Admission: RE | Admit: 2017-12-03 | Discharge: 2017-12-03 | Disposition: A | Discharge status: Home / Self Care | Payer: PRIVATE HEALTH INSURANCE | Source: Ambulatory Visit | Attending: Neurology | Admitting: Neurology

## 2017-12-03 DIAGNOSIS — R202 Paresthesia of skin: Secondary | ICD-10-CM

## 2017-12-03 DIAGNOSIS — R519 Headache, unspecified: Secondary | ICD-10-CM

## 2017-12-03 DIAGNOSIS — R51 Headache: Principal | ICD-10-CM

## 2017-12-03 MED ORDER — GADOBENATE DIMEGLUMINE 529 MG/ML IV SOLN
17.0000 mL | Freq: Once | INTRAVENOUS | Status: AC | PRN
  Administered 2017-12-03: 17 mL via INTRAVENOUS

## 2017-12-04 ENCOUNTER — Telehealth: Payer: Self-pay | Admitting: Neurology

## 2017-12-04 ENCOUNTER — Telehealth: Payer: Self-pay

## 2017-12-04 NOTE — Telephone Encounter (Signed)
Called and spoke with Pt, advised him of MRI results. He will contact PCP about sinusitis

## 2017-12-04 NOTE — Telephone Encounter (Signed)
-----   Message from Octaviano Battyebecca S Tat, DO sent at 12/04/2017  7:22 AM EDT ----- Let pt know that brain itself looks great.  May have some sinus disease (chronic sinusitis) and can f/u with PCP about that.

## 2017-12-10 ENCOUNTER — Emergency Department (HOSPITAL_COMMUNITY)
Admission: EM | Admit: 2017-12-10 | Discharge: 2017-12-10 | Disposition: A | Discharge status: Home / Self Care | Payer: PRIVATE HEALTH INSURANCE | Attending: Emergency Medicine | Admitting: Emergency Medicine

## 2017-12-10 ENCOUNTER — Encounter (HOSPITAL_COMMUNITY): Payer: Self-pay | Admitting: Emergency Medicine

## 2017-12-10 ENCOUNTER — Other Ambulatory Visit: Payer: Self-pay

## 2017-12-10 DIAGNOSIS — Z202 Contact with and (suspected) exposure to infections with a predominantly sexual mode of transmission: Secondary | ICD-10-CM | POA: Diagnosis not present

## 2017-12-10 DIAGNOSIS — Z79899 Other long term (current) drug therapy: Secondary | ICD-10-CM | POA: Insufficient documentation

## 2017-12-10 DIAGNOSIS — K056 Periodontal disease, unspecified: Secondary | ICD-10-CM | POA: Insufficient documentation

## 2017-12-10 DIAGNOSIS — Z113 Encounter for screening for infections with a predominantly sexual mode of transmission: Secondary | ICD-10-CM

## 2017-12-10 HISTORY — DX: Headache: R51

## 2017-12-10 HISTORY — DX: Headache, unspecified: R51.9

## 2017-12-10 NOTE — ED Triage Notes (Signed)
Pt went to dentist and told pt he had perodontal disease and was told he could have gotten it from kissing or oral sex and pt wanted to make sure he didn't need any other tx

## 2017-12-10 NOTE — Discharge Instructions (Addendum)
Please follow up with your primary doctor and/or your dentist/periodontist for further treatment of your periodontal disease.  You will be notified by us if any of your screening labs today are positive for infection.

## 2017-12-10 NOTE — ED Provider Notes (Signed)
[facility] EMERGENCY DEPARTMENT Provider Note   CSN: [id number] Arrival date & time: 12/10/17  1003     History   Chief Complaint Chief Complaint  Patient presents with  . Dental Pain    HPI Bryan Reilly is a 47 y.o. male presenting for screening for stds.  He was seen by a periodontist 3 months ago and was told he had advanced gum disease which can be caused by stds, but he was not screening for these infections.  He does endorse having a sore throat that lasted several weeks, now resolved for over 10 days, but also had a painless bump on the roof of his mouth, also now resolved.  He is sexually active with one male partner, does perform oral sex, denies she has any sx.  He also denies penile discharge, but does have occasional episodes of discomfort with urination, not currently.  He denies fevers, chills, n/v abdominal pain or other complaint.  The history is provided by the patient.    Past Medical History:  Diagnosis Date  . Bilateral headaches     There are no active problems to display for this patient.   Past Surgical History:  Procedure Laterality Date  . EYE SURGERY          Home Medications    Prior to Admission medications   Medication Sig Start Date End Date Taking? Authorizing Provider  fluticasone (FLONASE) 50 MCG/ACT nasal spray Place 2 sprays into both nostrils daily.    [provider]  folic acid (FOLVITE) 1 MG tablet Take 1 mg by mouth daily. 09/01/17   [provider]  ibuprofen (ADVIL,MOTRIN) 800 MG tablet Take 800 mg by mouth 3 (three) times daily as needed for moderate pain.  08/07/17   [provider]  nortriptyline (PAMELOR) 25 MG capsule Take 1 capsule (25 mg total) by mouth at bedtime. 11/30/17   Drema DallasJaffe, Adam R, DO  Turmeric Curcumin 500 MG CAPS Take 500 mg by mouth daily.    [provider]    Family History History reviewed. No pertinent family history.  Social History Social History   Tobacco Use   . Smoking status: Never Smoker  . Smokeless tobacco: Never Used  Substance Use Topics  . Alcohol use: No    Frequency: Never  . Drug use: No     Allergies   Patient has no known allergies.   Review of Systems Review of Systems  Constitutional: Negative for chills and fever.  HENT: Positive for dental problem, mouth sores and sore throat. Negative for ear pain and rhinorrhea.   Eyes: Negative for pain and visual disturbance.  Respiratory: Negative for cough and shortness of breath.   Cardiovascular: Negative for chest pain and palpitations.  Gastrointestinal: Negative for abdominal pain, nausea and vomiting.  Genitourinary: Positive for dysuria. Negative for discharge and hematuria.  Musculoskeletal: Negative for arthralgias and back pain.  Skin: Negative for color change and rash.  Neurological: Negative for headaches.  All other systems reviewed and are negative.    Physical Exam Updated Vital Signs BP 125/87   Pulse 84   Temp 97.9 F (36.6 C) (Oral)   Resp 16   SpO2 100%   Physical Exam  Constitutional: He appears well-developed and well-nourished.  HENT:  Head: Normocephalic and atraumatic.  Mouth/Throat: Oropharynx is clear and moist. No trismus in the jaw. No dental abscesses, uvula swelling or dental caries. No oropharyngeal exudate.  Teeth are intact, appear well cared for.  He does  have generalized gingival recession with some areas of mild gingival edema.  There is no erythema, fluctuance or evidence for acute infection.  Eyes: Conjunctivae are normal.  Neck: Normal range of motion.  Cardiovascular: Normal rate and regular rhythm.  Pulmonary/Chest: Effort normal and breath sounds normal. He has no wheezes.  Abdominal: Soft. Bowel sounds are normal. There is no tenderness.  Musculoskeletal: Normal range of motion.  Neurological: He is alert.  Skin: Skin is warm and dry.  Psychiatric: He has a normal mood and affect.  Nursing note and vitals  reviewed.    ED Treatments / Results  Labs (all labs ordered are listed, but only abnormal results are displayed) Labs Reviewed  RPR  GC/CHLAMYDIA PROBE AMP ([place]) NOT AT [facility]  GC/CHLAMYDIA PROBE AMP ([place]) NOT AT [facility]    EKG None  Radiology No results found.  Procedures Procedures (including critical care time)  Medications Ordered in ED Medications - No data to display   Initial Impression / Assessment and Plan / ED Course  I have reviewed the triage vital signs and the nursing notes.  Pertinent labs & imaging results that were available during my care of the patient were reviewed by me and considered in my medical decision making (see chart for details).     Patient here essentially for STD screening to rule this out as a source of his periodontal disease, as was suggested as a possible source by his periodontist.  Given painless lesion on the roof of his mouth, now resolved it is reasonable to screen for syphilis.  Also screen for gonorrhea and chlamydia although his oral exam is completely benign.  Patient was advised that these tests will take several days to result and he will be notified if anything is positive.  Advised PRN follow-up with his primary doctor or his periodontist.  Final Clinical Impressions(s) / ED Diagnoses   Final diagnoses:  Periodontal disease, unspecified  Screening examination for STD (sexually transmitted disease)    ED Discharge Orders    None       Victoriano Lain 12/10/17 1137    Samuel Jester, DO 12/15/17 1332

## 2017-12-10 NOTE — ED Notes (Signed)
RPR collected by lab.

## 2017-12-11 LAB — GC/CHLAMYDIA PROBE AMP (~~LOC~~) NOT AT ARMC
CHLAMYDIA, DNA PROBE: NEGATIVE
Chlamydia: NEGATIVE
Neisseria Gonorrhea: NEGATIVE
Neisseria Gonorrhea: NEGATIVE

## 2017-12-11 LAB — RPR: RPR: NONREACTIVE

## 2017-12-22 ENCOUNTER — Other Ambulatory Visit: Payer: Self-pay | Admitting: Neurology

## 2018-04-01 NOTE — Progress Notes (Deleted)
NEUROLOGY FOLLOW UP OFFICE NOTE  Bryan Reilly [id number]  HISTORY OF PRESENT ILLNESS: Bryan Reilly is a 47 year old male who follows up for headache and dysesthesias.  UPDATE:  MRI of the brain with and without contrast from 12/03/2017 was personally reviewed and again revealed left frontal, anterior ethmoid, and maxillary sinus mucosal thickening but no acute intracranial abnormality.  Intensity:  *** Duration:  *** Frequency:  *** Frequency of abortive medication: *** Current NSAIDS:  *** Current analgesics:  Excedrin Migraine Current triptans:  *** Current ergotamine:  *** Current anti-emetic:  *** Current muscle relaxants:  *** Current anti-anxiolytic:  *** Current sleep aide:  *** Current Antihypertensive medications:  *** Current Antidepressant medications:  Nortriptyline 25mg  at bedtime Current Anticonvulsant medications:  *** Current anti-CGRP:  *** Current Vitamins/Herbal/Supplements:  *** Current Antihistamines/Decongestants:  *** Other therapy:  *** Other medication:  ***  Caffeine:  *** Alcohol:  *** Smoker:  *** Diet:  *** Exercise:  *** Depression:  ***; Anxiety:  *** Other pain:  *** Sleep hygiene:  ***   HISTORY:  For about a year, he would experience episodes of burning discomfort involving his entire head and traveling down his neck and entire spine.  It would last for hours and occurred 2-3 times a week.  Around March 2019, he developed a new headache.  It is a severe holocephalic pressure-like headache that is associated with nausea and blurred vision but not vomiting, photophobia, phonophobia, autonomic symptoms or unilateral numbness or weakness.  The dysesthesias now occur with the headaches.  The last 2 hours and occur every other day.  Sitting still makes it worse.  Moving around helps relieve it.  He has taken Fioricet with variable results.  CT of head from 08/27/2017 was personally reviewed and was unremarkable.  CT of maxillary sinuses  demonstrated large 5 cm polyp or retention cyst extending through medial wall of left maxillary sinus with mucoperiosteal thickening.  Sinusitis was noted in the left maxillary sinus, left frontal sinus and left ethmoid air cells.  He has recurrent sinusitis.  In addition to sinusitis, he reports periodontal infection.  Past NSAIDs:  *** Past analgesics:  Fioricet Past triptans:  None Past ergots:  None Past antihypertensives:  None Past antidepressants:  None Past antiepileptic medications:  None Past anti-CGRP:  None  PAST MEDICAL HISTORY: Past Medical History:  Diagnosis Date  . Bilateral headaches     MEDICATIONS: Current Outpatient Medications on File Prior to Visit  Medication Sig Dispense Refill  . fluticasone (FLONASE) 50 MCG/ACT nasal spray Place 2 sprays into both nostrils daily.    . folic acid (FOLVITE) 1 MG tablet Take 1 mg by mouth daily.  2  . ibuprofen (ADVIL,MOTRIN) 800 MG tablet Take 800 mg by mouth 3 (three) times daily as needed for moderate pain.   1  . nortriptyline (PAMELOR) 25 MG capsule TAKE 1 CAPSULE (25 MG TOTAL) BY MOUTH AT BEDTIME. 90 capsule 2  . Turmeric Curcumin 500 MG CAPS Take 500 mg by mouth daily.     No current facility-administered medications on file prior to visit.     ALLERGIES: No Known Allergies  FAMILY HISTORY: No family history on file. ***.  SOCIAL HISTORY: Social History   Socioeconomic History  . Marital status: Married    Spouse name: Not on file  . Number of children: Not on file  . Years of education: Not on file  . Highest education level: Not on file  Occupational History  . Not on  file  Social Needs  . Financial resource strain: Not on file  . Food insecurity:    Worry: Not on file    Inability: Not on file  . Transportation needs:    Medical: Not on file    Non-medical: Not on file  Tobacco Use  . Smoking status: Never Smoker  . Smokeless tobacco: Never Used  Substance and Sexual Activity  . Alcohol use:  No    Frequency: Never  . Drug use: No  . Sexual activity: Not on file  Lifestyle  . Physical activity:    Days per week: Not on file    Minutes per session: Not on file  . Stress: Not on file  Relationships  . Social connections:    Talks on phone: Not on file    Gets together: Not on file    Attends religious service: Not on file    Active member of club or organization: Not on file    Attends meetings of clubs or organizations: Not on file    Relationship status: Not on file  . Intimate partner violence:    Fear of current or ex partner: Not on file    Emotionally abused: Not on file    Physically abused: Not on file    Forced sexual activity: Not on file  Other Topics Concern  . Not on file  Social History Narrative   Pt lives in 2 story home with his wife, 2 step children and his wife's grandmother   Has 2 children of his own   12th grade education   Worked for [employer]    REVIEW OF SYSTEMS: Constitutional: No fevers, chills, or sweats, no generalized fatigue, change in appetite Eyes: No visual changes, double vision, eye pain Ear, nose and throat: No hearing loss, ear pain, nasal congestion, sore throat Cardiovascular: No chest pain, palpitations Respiratory:  No shortness of breath at rest or with exertion, wheezes GastrointestinaI: No nausea, vomiting, diarrhea, abdominal pain, fecal incontinence Genitourinary:  No dysuria, urinary retention or frequency Musculoskeletal:  No neck pain, back pain Integumentary: No rash, pruritus, skin lesions Neurological: as above Psychiatric: No depression, insomnia, anxiety Endocrine: No palpitations, fatigue, diaphoresis, mood swings, change in appetite, change in weight, increased thirst Hematologic/Lymphatic:  No purpura, petechiae. Allergic/Immunologic: no itchy/runny eyes, nasal congestion, recent allergic reactions, rashes  PHYSICAL EXAM: *** General: No acute distress.  Patient appears ***-groomed.  *** body  habitus. Head:  Normocephalic/atraumatic Eyes:  Fundi examined but not visualized Neck: supple, no paraspinal tenderness, full range of motion Heart:  Regular rate and rhythm Lungs:  Clear to auscultation bilaterally Back: No paraspinal tenderness Neurological Exam: alert and oriented to person, place, and time. Attention span and concentration intact, recent and remote memory intact, fund of knowledge intact.  Speech fluent and not dysarthric, language intact.  CN II-XII intact. Bulk and tone normal, muscle strength 5/5 throughout.  Sensation to light touch, temperature and vibration intact.  Deep tendon reflexes 2+ throughout, toes downgoing.  Finger to nose and heel to shin testing intact.  Gait normal, Romberg negative.  IMPRESSION: ***  PLAN: ***  Shon Millet, DO  CC: ***

## 2018-04-02 ENCOUNTER — Ambulatory Visit: Payer: PRIVATE HEALTH INSURANCE | Admitting: Neurology

## 2018-05-07 NOTE — Progress Notes (Signed)
NEUROLOGY FOLLOW UP OFFICE NOTE  Levonte Molina [id number]  HISTORY OF PRESENT ILLNESS: Bryan Reilly is a 47 year old male who follows up for headache and dysesthesias.  UPDATE: MRI of brain with and without contrast from 12/03/2017 was personally reviewed and was normal except for some evidence of chronic sinusitis.   He stopped nortriptyline because it made him sick, although it helped resolve headaches.  They are less frequent.  They occur 6 or 7 days a month.  Ibuprofen helps ease the pain but headache does not resolve for a day.  He really notes left posterior neck pain radiating into the shoulder.  Turning head to left makes it worse.  Movement helps relieve it.   Current NSAIDS:  ibuprofen Current analgesics:  none Current triptans:  none Current ergotamine:  none Current anti-emetic:  none Current muscle relaxants:  none Current anti-anxiolytic:  none Current sleep aide:  none Current Antihypertensive medications:  none Current Antidepressant medications:  none Current Anticonvulsant medications:  none Current anti-CGRP:  none Current Vitamins/Herbal/Supplements:  turmeric Current Antihistamines/Decongestants:  none Other therapy:  none Other medication:  none  Diet:  Stopped sodas Exercise:  Not routine Depression:  no; Anxiety:  no  HISTORY: Since 2018, he would experience episodes of burning discomfort involving his entire head and traveling down his neck and entire spine.  It would last for hours and occur 2 to 3 times a week.  In March 2019, he developed a new headache.  It is a severe holocephalic pressure-like headache that is associated with nausea and blurred vision but not vomiting, photophobia, phonophobia, autonomic symptoms or unilateral numbness or weakness.  The dysesthesias now occur with the headaches.  They last 2 hours and occur every other day.  Sitting still makes it worse.  Moving around helps relieve it.  He has taken Fioricet with variable  results.  Past medication includes:  Fioricet, nortriptyline (made him sick)  CT of head from 08/27/17 was personally reviewed and was unremarkable.  CT of maxillary sinuses demonstrated large 5 cm polyp or retention cyst extending through medial wall of left maxillary sinus with mucoperiosteal thickening.  Sinusitis was noted in the left maxillary sinus, left frontal sinus and left ethmoid air cells.  In addition to recent sinusitis, he reports periodontal infection.  PAST MEDICAL HISTORY: Past Medical History:  Diagnosis Date  . Bilateral headaches     MEDICATIONS: Current Outpatient Medications on File Prior to Visit  Medication Sig Dispense Refill  . fluticasone (FLONASE) 50 MCG/ACT nasal spray Place 2 sprays into both nostrils daily.    . folic acid (FOLVITE) 1 MG tablet Take 1 mg by mouth daily.  2  . ibuprofen (ADVIL,MOTRIN) 800 MG tablet Take 800 mg by mouth 3 (three) times daily as needed for moderate pain.   1  . nortriptyline (PAMELOR) 25 MG capsule TAKE 1 CAPSULE (25 MG TOTAL) BY MOUTH AT BEDTIME. 90 capsule 2  . Turmeric Curcumin 500 MG CAPS Take 500 mg by mouth daily.     No current facility-administered medications on file prior to visit.     ALLERGIES: No Known Allergies  FAMILY HISTORY: No family history on file.  SOCIAL HISTORY: Social History   Socioeconomic History  . Marital status: Married    Spouse name: Not on file  . Number of children: Not on file  . Years of education: Not on file  . Highest education level: Not on file  Occupational History  . Not on file  Social Needs  .  Financial resource strain: Not on file  . Food insecurity:    Worry: Not on file    Inability: Not on file  . Transportation needs:    Medical: Not on file    Non-medical: Not on file  Tobacco Use  . Smoking status: Never Smoker  . Smokeless tobacco: Never Used  Substance and Sexual Activity  . Alcohol use: No    Frequency: Never  . Drug use: No  . Sexual  activity: Not on file  Lifestyle  . Physical activity:    Days per week: Not on file    Minutes per session: Not on file  . Stress: Not on file  Relationships  . Social connections:    Talks on phone: Not on file    Gets together: Not on file    Attends religious service: Not on file    Active member of club or organization: Not on file    Attends meetings of clubs or organizations: Not on file    Relationship status: Not on file  . Intimate partner violence:    Fear of current or ex partner: Not on file    Emotionally abused: Not on file    Physically abused: Not on file    Forced sexual activity: Not on file  Other Topics Concern  . Not on file  Social History Narrative   Pt lives in 2 story home with his wife, 2 step children and his wife's grandmother   Has 2 children of his own   12th grade education   Worked for [employer]    REVIEW OF SYSTEMS: Constitutional: No fevers, chills, or sweats, no generalized fatigue, change in appetite Eyes: No visual changes, double vision, eye pain Ear, nose and throat: No hearing loss, ear pain, nasal congestion, sore throat Cardiovascular: No chest pain, palpitations Respiratory:  No shortness of breath at rest or with exertion, wheezes GastrointestinaI: No nausea, vomiting, diarrhea, abdominal pain, fecal incontinence Genitourinary:  No dysuria, urinary retention or frequency Musculoskeletal:  No neck pain, back pain Integumentary: No rash, pruritus, skin lesions Neurological: as above Psychiatric: No depression, insomnia, anxiety Endocrine: No palpitations, fatigue, diaphoresis, mood swings, change in appetite, change in weight, increased thirst Hematologic/Lymphatic:  No purpura, petechiae. Allergic/Immunologic: no itchy/runny eyes, nasal congestion, recent allergic reactions, rashes  PHYSICAL EXAM: Blood pressure 126/78, pulse 69, height 6\' 4"  (1.93 m), weight 185 lb (83.9 kg), SpO2 99 %. General: No acute distress.  Patient  appears well-groomed.   Head:  Normocephalic/atraumatic Eyes:  Fundi examined but not visualized Neck: supple, no paraspinal tenderness, full range of motion Heart:  Regular rate and rhythm Lungs:  Clear to auscultation bilaterally Back: No paraspinal tenderness Neurological Exam: alert and oriented to person, place, and time. Attention span and concentration intact, recent and remote memory intact, fund of knowledge intact.  Speech fluent and not dysarthric, language intact.  CN II-XII intact. Bulk and tone normal, muscle strength 5/5 throughout.  Sensation to light touch, temperature and vibration intact.  Deep tendon reflexes 2+ throughout, toes downgoing.  Finger to nose and heel to shin testing intact.  Gait normal, Romberg negative.  IMPRESSION: Cervicalgia/left sided cervical radiculopathy  PLAN: 1. I will refer him to Dr. Antoine PrimasZachary Smith from Sports Medicine for treatment of neck pain. 2.  I will start gabapentin 100mg  at bedtime.  We can increase dose by 100mg  as needed. 3.  Follow up in 3 to 4 months.  Shon MilletAdam Jaffe, DO  CC: Renaye RakersVeita Bland, MD

## 2018-05-09 ENCOUNTER — Encounter: Payer: Self-pay | Admitting: Neurology

## 2018-05-09 ENCOUNTER — Ambulatory Visit (INDEPENDENT_AMBULATORY_CARE_PROVIDER_SITE_OTHER): Payer: PRIVATE HEALTH INSURANCE | Admitting: Neurology

## 2018-05-09 VITALS — BP 126/78 | HR 69 | Ht 76.0 in | Wt 185.0 lb

## 2018-05-09 DIAGNOSIS — M5412 Radiculopathy, cervical region: Secondary | ICD-10-CM | POA: Diagnosis not present

## 2018-05-09 MED ORDER — GABAPENTIN 100 MG PO CAPS: 100.0000 mg | ORAL_CAPSULE | Freq: Every day | ORAL | 3 refills | Status: DC

## 2018-05-09 NOTE — Patient Instructions (Addendum)
1.  We will start gabapentin 100mg  at bedtime.  Contact me if you would like to increase dose.  Continue turmeric. 2.  I will refer you to Dr. Antoine PrimasZachary Smith, a Sports Medicine doctor you treats neck pain 3.  Follow up with me in 3 to 4 months.

## 2018-05-13 ENCOUNTER — Encounter: Payer: Self-pay | Admitting: Family Medicine

## 2018-06-03 ENCOUNTER — Other Ambulatory Visit: Payer: Self-pay | Admitting: Neurology

## 2018-08-29 ENCOUNTER — Other Ambulatory Visit: Payer: Self-pay

## 2018-08-29 ENCOUNTER — Encounter (HOSPITAL_COMMUNITY): Payer: Self-pay

## 2018-08-29 ENCOUNTER — Emergency Department (HOSPITAL_COMMUNITY)
Admission: EM | Admit: 2018-08-29 | Discharge: 2018-08-29 | Discharge status: Against Medical Advice | Payer: PRIVATE HEALTH INSURANCE | Attending: Emergency Medicine | Admitting: Emergency Medicine

## 2018-08-29 DIAGNOSIS — R509 Fever, unspecified: Secondary | ICD-10-CM | POA: Insufficient documentation

## 2018-08-29 DIAGNOSIS — Z5321 Procedure and treatment not carried out due to patient leaving prior to being seen by health care provider: Secondary | ICD-10-CM | POA: Insufficient documentation

## 2018-08-29 NOTE — ED Triage Notes (Signed)
Pt complains of a fever all day today, an hour ago he had tylenol and cold medicine

## 2018-09-17 ENCOUNTER — Ambulatory Visit: Payer: PRIVATE HEALTH INSURANCE | Admitting: Neurology

## 2019-02-04 ENCOUNTER — Ambulatory Visit
Admission: RE | Admit: 2019-02-04 | Discharge: 2019-02-04 | Disposition: A | Discharge status: Home / Self Care | Payer: PRIVATE HEALTH INSURANCE | Source: Ambulatory Visit | Attending: Family Medicine | Admitting: Family Medicine

## 2019-02-04 ENCOUNTER — Other Ambulatory Visit: Payer: Self-pay | Admitting: Family Medicine

## 2019-02-04 DIAGNOSIS — M1310 Monoarthritis, not elsewhere classified, unspecified site: Secondary | ICD-10-CM

## 2019-02-04 DIAGNOSIS — L03115 Cellulitis of right lower limb: Secondary | ICD-10-CM

## 2019-12-10 ENCOUNTER — Encounter (HOSPITAL_COMMUNITY): Payer: Self-pay | Admitting: Emergency Medicine

## 2019-12-10 ENCOUNTER — Emergency Department (HOSPITAL_COMMUNITY): Payer: PRIVATE HEALTH INSURANCE

## 2019-12-10 ENCOUNTER — Other Ambulatory Visit: Payer: Self-pay

## 2019-12-10 ENCOUNTER — Emergency Department (HOSPITAL_COMMUNITY)
Admission: EM | Admit: 2019-12-10 | Discharge: 2019-12-10 | Disposition: A | Discharge status: Home / Self Care | Payer: PRIVATE HEALTH INSURANCE | Attending: Emergency Medicine | Admitting: Emergency Medicine

## 2019-12-10 DIAGNOSIS — Z79899 Other long term (current) drug therapy: Secondary | ICD-10-CM | POA: Diagnosis not present

## 2019-12-10 DIAGNOSIS — R0789 Other chest pain: Secondary | ICD-10-CM | POA: Diagnosis not present

## 2019-12-10 DIAGNOSIS — R079 Chest pain, unspecified: Secondary | ICD-10-CM | POA: Diagnosis present

## 2019-12-10 LAB — BASIC METABOLIC PANEL
Anion gap: 7 (ref 5–15)
BUN: 13 mg/dL (ref 6–20)
CO2: 30 mmol/L (ref 22–32)
Calcium: 8.7 mg/dL — ABNORMAL LOW (ref 8.9–10.3)
Chloride: 103 mmol/L (ref 98–111)
Creatinine, Ser: 1.05 mg/dL (ref 0.61–1.24)
GFR calc Af Amer: >60 mL/min (ref >60)
GFR calc non Af Amer: >60 mL/min (ref >60)
Glucose, Bld: 96 mg/dL (ref 70–99)
Potassium: 4.4 mmol/L (ref 3.5–5.1)
Sodium: 140 mmol/L (ref 135–145)

## 2019-12-10 LAB — CBC
HCT: 42 % (ref 39.0–52.0)
Hemoglobin: 13 g/dL (ref 13.0–17.0)
MCH: 29.5 pg (ref 26.0–34.0)
MCHC: 31 g/dL (ref 30.0–36.0)
MCV: 95.2 fL (ref 80.0–100.0)
Platelets: 211 10*3/uL (ref 150–400)
RBC: 4.41 MIL/uL (ref 4.22–5.81)
RDW: 13 % (ref 11.5–15.5)
WBC: 5.7 10*3/uL (ref 4.0–10.5)
nRBC: 0 % (ref 0.0–0.2)

## 2019-12-10 LAB — TROPONIN I (HIGH SENSITIVITY)
Troponin I (High Sensitivity): <2 ng/L (ref <18)
Troponin I (High Sensitivity): <2 ng/L (ref <18)

## 2019-12-10 MED ORDER — ACETAMINOPHEN 500 MG PO TABS: 500.0000 mg | ORAL_TABLET | Freq: Four times a day (QID) | ORAL | 0 refills | Status: DC | PRN

## 2019-12-10 MED ORDER — IBUPROFEN 600 MG PO TABS: 600.0000 mg | ORAL_TABLET | Freq: Four times a day (QID) | ORAL | 0 refills | Status: DC | PRN

## 2019-12-10 MED ORDER — KETOROLAC TROMETHAMINE 30 MG/ML IJ SOLN: 30.0000 mg | Freq: Once | INTRAMUSCULAR | Status: DC

## 2019-12-10 MED ORDER — SODIUM CHLORIDE 0.9% FLUSH: 3.0000 mL | Freq: Once | INTRAVENOUS | Status: DC

## 2019-12-10 NOTE — ED Provider Notes (Signed)
[facility]-EMERGENCY DEPT Provider Note   CSN: [id number] Arrival date & time: 12/10/19  1604     History Chief Complaint  Patient presents with  . Chest Pain    Bryan Reilly is a 49 y.o. male presents for evaluation of acute onset, intermittent substernal chest pains that began 5 days ago.  Patient reports that symptoms began on Saturday, worsened on Sunday and so he went to a [facility] to get Covid tested.  Reports the pain was fairly constant on Saturday and Sunday but is now more intermittent.  The pain is midline just off to the right, described as dull and aching.  It worsens with deep inspiration and certain movements involving rotation at the chest.  At 1 point a few days ago he had very short-lived shortness of breath and the pain intensified but since then has had no shortness of breath.  He denies nausea, vomiting, fever, abdominal pain, diaphoresis, syncope, or lightheadedness.  Developed mild nonproductive cough 3 days ago.  No known sick contacts.  His Covid test was negative.  He took ibuprofen with improvement in his symptoms and Tylenol with no change in his pain.  He is a non-smoker, denies recreational drug use, no significant family history of heart disease under the age of 75.  He denies recent travel or surgeries, hemoptysis, prior history of DVT or PE, or hormone replacement therapy.  He states that he was told by his employer to be evaluated for his chest pain since it was persistent.  He states that he does a lot of heavy lifting at work.  He reports that the last time he felt similar pain was when he was seen in the ED on 10/27/2017 and was told that he had costochondritis/likely musculoskeletal etiology.  The history is provided by the patient.       Past Medical History:  Diagnosis Date  . Bilateral headaches     There are no problems to display for this patient.   Past Surgical History:  Procedure Laterality Date  . EYE SURGERY          Family History  Problem Relation Age of Onset  . CVA Mother   . Hypertension Mother   . Coronary artery disease Mother   . Lung cancer Father     Social History   Tobacco Use  . Smoking status: Never Smoker  . Smokeless tobacco: Never Used  Vaping Use  . Vaping Use: Never used  Substance Use Topics  . Alcohol use: No  . Drug use: No    Home Medications Prior to Admission medications   Medication Sig Start Date End Date Taking? Authorizing Provider  fluticasone (FLONASE) 50 MCG/ACT nasal spray Place 2 sprays into both nostrils daily.   Yes [provider]  Turmeric Curcumin 500 MG CAPS Take 500 mg by mouth daily.   Yes [provider]  acetaminophen (TYLENOL) 500 MG tablet Take 1 tablet (500 mg total) by mouth every 6 (six) hours as needed. 12/10/19   Fawze, Mina A, PA-C  gabapentin (NEURONTIN) 100 MG capsule TAKE 1 CAPSULE (100 MG TOTAL) BY MOUTH AT BEDTIME. Patient not taking: Reported on 12/10/2019 06/04/18   Drema Dallas, DO  ibuprofen (ADVIL) 600 MG tablet Take 1 tablet (600 mg total) by mouth every 6 (six) hours as needed. 12/10/19   Michela Pitcher A, PA-C    Allergies    Patient has no known allergies.  Review of Systems   Review  of Systems  Constitutional: Negative for chills and fever.  Respiratory: Positive for cough. Negative for shortness of breath (Resolved).   Cardiovascular: Positive for chest pain. Negative for leg swelling.  Gastrointestinal: Negative for abdominal pain, nausea and vomiting.  All other systems reviewed and are negative.   Physical Exam Updated Vital Signs BP (!) 141/100   Pulse (!) 53   Temp 98.3 F (36.8 C) (Oral)   Resp 16   SpO2 100%   Physical Exam Vitals and nursing note reviewed.  Constitutional:      General: He is not in acute distress.    Appearance: He is well-developed.  HENT:     Head: Normocephalic and atraumatic.  Eyes:     General:        Right eye: No discharge.        Left eye: No  discharge.     Conjunctiva/sclera: Conjunctivae normal.  Neck:     Vascular: No JVD.     Trachea: No tracheal deviation.  Cardiovascular:     Rate and Rhythm: Normal rate and regular rhythm.     Pulses:          Radial pulses are 2+ on the right side and 2+ on the left side.       Dorsalis pedis pulses are 2+ on the right side and 2+ on the left side.       Posterior tibial pulses are 2+ on the right side and 2+ on the left side.     Comments: Homans sign absent bilaterally, no lower extremity edema, no palpable cords, compartments are soft  Pulmonary:     Effort: Pulmonary effort is normal.     Comments: Speaking in full sentences without difficulty, SPO2 saturations 100% on room air Chest:     Chest wall: Tenderness present.       Comments: Focal right parasternal chest wall tenderness with no deformity, crepitus, ecchymosis or flail segment. Abdominal:     General: Bowel sounds are normal. There is no distension.     Palpations: Abdomen is soft. There is no mass.     Tenderness: There is no abdominal tenderness.  Musculoskeletal:     Right lower leg: No tenderness. No edema.     Left lower leg: No tenderness. No edema.  Skin:    General: Skin is warm and dry.     Findings: No erythema.  Neurological:     Mental Status: He is alert.  Psychiatric:        Behavior: Behavior normal.     ED Results / Procedures / Treatments   Labs (all labs ordered are listed, but only abnormal results are displayed) Labs Reviewed  BASIC METABOLIC PANEL - Abnormal; Notable for the following components:      Result Value   Calcium 8.7 (*)    All other components within normal limits  CBC  TROPONIN I (HIGH SENSITIVITY)  TROPONIN I (HIGH SENSITIVITY)    ED ECG REPORT   Date: 12/10/2019  Rate: 66  Rhythm: normal sinus rhythm  QRS Axis: normal  Intervals: normal  ST/T Wave abnormalities: normal  Conduction Disutrbances:none  Narrative Interpretation:   Old EKG Reviewed: unchanged   I have personally reviewed the EKG tracing and agree with the computerized printout as noted.  Radiology DG Chest 2 View  Result Date: 12/10/2019 CLINICAL DATA:  Chest pain EXAM: CHEST - 2 VIEW COMPARISON:  10/27/2017 FINDINGS: The heart size and mediastinal contours are within normal limits. Both lungs  are clear. The visualized skeletal structures are unremarkable. IMPRESSION: No active cardiopulmonary disease. Electronically Signed   By: Elige Ko   On: 12/10/2019 16:55    Procedures Procedures (including critical care time)  Medications Ordered in ED Medications - No data to display  ED Course  I have reviewed the triage vital signs and the nursing notes.  Pertinent labs & imaging results that were available during my care of the patient were reviewed by me and considered in my medical decision making (see chart for details).    MDM Rules/Calculators/A&P                          Patient presenting for evaluation of intermittent chest pains for 5 days.  At one point had an episode of shortness of breath but this was short-lived and has since resolved and has not recurred.  He is afebrile, vital signs are stable.  He is nontoxic in appearance.  His pain is reproducible on palpation along a focal area of the chest wall suggesting likely musculoskeletal component.  His EKG shows no acute ischemic abnormalities or arrhythmia and serial troponins are negative, symptoms thought to be less likely due to cardiac etiology.  He is overall low risk for cardiac disease.  He is PERC negative, doubt PE at this time.  Doubt dissection, cardiac tamponade, esophageal rupture, pneumonia or pneumothorax on chest x-ray shows no acute cardiopulmonary abnormalities.  We will treat for musculoskeletal pain versus pleurisy with NSAIDs, Tylenol, ice/heat therapy.  He has had similar symptoms previously and was diagnosed with costochondritis at that time.  Recommend follow-up with PCP for reevaluation of  symptoms.  Discussed strict ED return precautions. Patient verbalized understanding of and agreement with plan and is safe for discharge home at this time.    Final Clinical Impression(s) / ED Diagnoses Final diagnoses:  Atypical chest pain  Chest wall pain    Rx / DC Orders ED Discharge Orders         Ordered    ibuprofen (ADVIL) 600 MG tablet  Every 6 hours PRN     Discontinue  Reprint     12/10/19 2053    acetaminophen (TYLENOL) 500 MG tablet  Every 6 hours PRN     Discontinue  Reprint     12/10/19 2053           Jeanie Sewer, PA-C 12/10/19 2056    Maia Plan, MD 12/11/19 2036

## 2019-12-10 NOTE — ED Triage Notes (Signed)
Patient here from home reporting chest pain that started Saturday with SOB. Denis n/v. Reports pain in center of chest. Reports both covid injections.

## 2019-12-10 NOTE — Discharge Instructions (Signed)
Your work-up today was reassuring with no signs of heart attack.  Your chest pain seems more consistent with musculoskeletal pain or pleurisy which is an inflammation of the lining between the lungs and the rib cage.  Both of these conditions are typically treated with anti-inflammatories and rest.  Can also do some gentle stretching to avoid muscle stiffness.  Take 600 mg of ibuprofen every 6 hours as needed for pain.  Can also alternate between ibuprofen and Tylenol.  Take ibuprofen with food.  Do not exceed more than 4000 mg of Tylenol daily.  You can apply topical creams such as Salonpas, lidocaine cream, icy hot to areas of pain.  Can also apply ice or heat whichever feels best 20 minutes at a time a few times daily.  Do some gentle stretching.  Follow-up with your primary care provider for reevaluation of your symptoms.  Return to the emergency department if any concerning signs or symptoms develop such as worsening pain, shortness of breath, fevers, loss of consciousness.

## 2019-12-17 ENCOUNTER — Other Ambulatory Visit: Payer: Self-pay

## 2019-12-17 ENCOUNTER — Emergency Department (HOSPITAL_COMMUNITY): Payer: PRIVATE HEALTH INSURANCE

## 2019-12-17 ENCOUNTER — Emergency Department (HOSPITAL_COMMUNITY)
Admission: EM | Admit: 2019-12-17 | Discharge: 2019-12-17 | Disposition: A | Discharge status: Home / Self Care | Payer: PRIVATE HEALTH INSURANCE | Attending: Emergency Medicine | Admitting: Emergency Medicine

## 2019-12-17 ENCOUNTER — Encounter (HOSPITAL_COMMUNITY): Payer: Self-pay | Admitting: Emergency Medicine

## 2019-12-17 DIAGNOSIS — R002 Palpitations: Secondary | ICD-10-CM | POA: Diagnosis present

## 2019-12-17 DIAGNOSIS — K2900 Acute gastritis without bleeding: Secondary | ICD-10-CM | POA: Diagnosis not present

## 2019-12-17 DIAGNOSIS — R0602 Shortness of breath: Secondary | ICD-10-CM | POA: Insufficient documentation

## 2019-12-17 DIAGNOSIS — R1110 Vomiting, unspecified: Secondary | ICD-10-CM | POA: Diagnosis not present

## 2019-12-17 LAB — COMPREHENSIVE METABOLIC PANEL
ALT: 21 U/L (ref 0–44)
AST: 27 U/L (ref 15–41)
Albumin: 4.7 g/dL (ref 3.5–5.0)
Alkaline Phosphatase: 34 U/L — ABNORMAL LOW (ref 38–126)
Anion gap: 7 (ref 5–15)
BUN: 26 mg/dL — ABNORMAL HIGH (ref 6–20)
CO2: 30 mmol/L (ref 22–32)
Calcium: 8.7 mg/dL — ABNORMAL LOW (ref 8.9–10.3)
Chloride: 102 mmol/L (ref 98–111)
Creatinine, Ser: 1.27 mg/dL — ABNORMAL HIGH (ref 0.61–1.24)
GFR calc Af Amer: >60 mL/min (ref >60)
GFR calc non Af Amer: >60 mL/min (ref >60)
Glucose, Bld: 99 mg/dL (ref 70–99)
Potassium: 4 mmol/L (ref 3.5–5.1)
Sodium: 139 mmol/L (ref 135–145)
Total Bilirubin: 0.5 mg/dL (ref 0.3–1.2)
Total Protein: 8.1 g/dL (ref 6.5–8.1)

## 2019-12-17 LAB — CBC
HCT: 43.5 % (ref 39.0–52.0)
Hemoglobin: 13.3 g/dL (ref 13.0–17.0)
MCH: 29.1 pg (ref 26.0–34.0)
MCHC: 30.6 g/dL (ref 30.0–36.0)
MCV: 95.2 fL (ref 80.0–100.0)
Platelets: 199 10*3/uL (ref 150–400)
RBC: 4.57 MIL/uL (ref 4.22–5.81)
RDW: 12.6 % (ref 11.5–15.5)
WBC: 6.7 10*3/uL (ref 4.0–10.5)
nRBC: 0 % (ref 0.0–0.2)

## 2019-12-17 LAB — URINALYSIS, ROUTINE W REFLEX MICROSCOPIC
Bilirubin Urine: NEGATIVE
Glucose, UA: NEGATIVE mg/dL
Hgb urine dipstick: NEGATIVE
Ketones, ur: NEGATIVE mg/dL
Leukocytes,Ua: NEGATIVE
Nitrite: NEGATIVE
Protein, ur: NEGATIVE mg/dL
Specific Gravity, Urine: 1.019 (ref 1.005–1.030)
pH: 7 (ref 5.0–8.0)

## 2019-12-17 LAB — TROPONIN I (HIGH SENSITIVITY)
Troponin I (High Sensitivity): <2 ng/L (ref <18)
Troponin I (High Sensitivity): <2 ng/L (ref <18)

## 2019-12-17 LAB — LIPASE, BLOOD: Lipase: 42 U/L (ref 11–51)

## 2019-12-17 MED ORDER — LIDOCAINE VISCOUS HCL 2 % MT SOLN
15.0000 mL | Freq: Once | OROMUCOSAL | Status: AC
  Administered 2019-12-17: 15 mL via ORAL
  Filled 2019-12-17: qty 15

## 2019-12-17 MED ORDER — ALUM & MAG HYDROXIDE-SIMETH 200-200-20 MG/5ML PO SUSP
30.0000 mL | Freq: Once | ORAL | Status: AC
  Administered 2019-12-17: 30 mL via ORAL
  Filled 2019-12-17: qty 30

## 2019-12-17 MED ORDER — SODIUM CHLORIDE 0.9% FLUSH: 3.0000 mL | Freq: Once | INTRAVENOUS | Status: DC

## 2019-12-17 MED ORDER — OMEPRAZOLE 20 MG PO CPDR
20.0000 mg | DELAYED_RELEASE_CAPSULE | Freq: Two times a day (BID) | ORAL | 0 refills | Status: DC
Start: 2019-12-17 — End: 2021-09-16

## 2019-12-17 MED ORDER — SODIUM CHLORIDE 0.9 % IV BOLUS
1000.0000 mL | Freq: Once | INTRAVENOUS | Status: AC
  Administered 2019-12-17: 1000 mL via INTRAVENOUS

## 2019-12-17 NOTE — Discharge Instructions (Signed)
Please avoid alcohol use that it may aggravate your chest discomfort.  Take Prilosec 30 minutes before each major meal.  Follow-up with cardiologist for further evaluation of your heart palpitation.  Return if you have any concern.

## 2019-12-17 NOTE — ED Triage Notes (Signed)
Patient states he is having irregular heartbeats. Patient also complaining of throwing up the other day per patient. Patient state he wants an ECG. Explain to patient that the doctor has to order that and that is usually an outpatient test.

## 2019-12-17 NOTE — ED Provider Notes (Signed)
[facility]-EMERGENCY DEPT Provider Note   CSN: [id number] Arrival date & time: 12/17/19  0346     History Chief Complaint  Patient presents with  . Emesis  . Palpitations    Bryan Reilly is a 49 y.o. male.  The history is provided by the patient and medical records. No language interpreter was used.  Emesis Palpitations Associated symptoms: vomiting      49 year old male presenting complaining of heart palpitation.  Patient report for the past week he has had intermittent heart palpitation and chest discomfort which concerns him.  He report he noticed the chest discomfort and palpitation especially at rest which resolved when he is moving about or doing things.  Symptoms would happen sporadically lasting for few seconds.  He endorsed feeling short of breath twice during this episode which was short lasting.  He does not complain of any fever, productive cough, abdominal pain, back pain, nausea, diaphoresis.  He report having Moderna COVID vaccination 3 months ago and was wondering if it may contribute to his chest pain.  He does admits to heavy alcohol use but states that he recently quit drinking several days ago.  He denies tobacco use.  No prior history of PE or DVT no recent surgery, prolonged bedrest active cancer or hemoptysis.  He does endorse some chest discomfort at this time states pain is mild.  He mention being seen for this condition several days ago and was told that it could be pleurisy and he was given NSAIDs.  Reports some improvement of his symptoms but symptoms never fully resolved.  Past Medical History:  Diagnosis Date  . Bilateral headaches     There are no problems to display for this patient.   Past Surgical History:  Procedure Laterality Date  . EYE SURGERY         Family History  Problem Relation Age of Onset  . CVA Mother   . Hypertension Mother   . Coronary artery disease Mother   . Lung cancer Father     Social History    Tobacco Use  . Smoking status: Never Smoker  . Smokeless tobacco: Never Used  Vaping Use  . Vaping Use: Never used  Substance Use Topics  . Alcohol use: No  . Drug use: No    Home Medications Prior to Admission medications   Medication Sig Start Date End Date Taking? Authorizing Provider  acetaminophen (TYLENOL) 500 MG tablet Take 1 tablet (500 mg total) by mouth every 6 (six) hours as needed. 12/10/19   Fawze, Mina A, PA-C  fluticasone (FLONASE) 50 MCG/ACT nasal spray Place 2 sprays into both nostrils daily.    [provider]  gabapentin (NEURONTIN) 100 MG capsule TAKE 1 CAPSULE (100 MG TOTAL) BY MOUTH AT BEDTIME. Patient not taking: Reported on 12/10/2019 06/04/18   Drema Dallas, DO  ibuprofen (ADVIL) 600 MG tablet Take 1 tablet (600 mg total) by mouth every 6 (six) hours as needed. 12/10/19   Fawze, Mina A, PA-C  Turmeric Curcumin 500 MG CAPS Take 500 mg by mouth daily.    [provider]    Allergies    Patient has no known allergies.  Review of Systems   Review of Systems  Cardiovascular: Positive for palpitations.  Gastrointestinal: Positive for vomiting.  All other systems reviewed and are negative.   Physical Exam Updated Vital Signs BP 119/76 (BP Location: Right Arm)   Pulse 65   Temp 97.9 F (36.6 C) (Oral)  Resp 15   Ht 6\' 4"  (1.93 m)   Wt 91.2 kg   SpO2 100%   BMI 24.47 kg/m   Physical Exam Vitals and nursing note reviewed.  Constitutional:      General: He is not in acute distress.    Appearance: He is well-developed.  HENT:     Head: Atraumatic.  Eyes:     Conjunctiva/sclera: Conjunctivae normal.  Cardiovascular:     Rate and Rhythm: Normal rate and regular rhythm.     Pulses: Normal pulses.     Heart sounds: Normal heart sounds.  Pulmonary:     Effort: Pulmonary effort is normal.     Breath sounds: Normal breath sounds.  Abdominal:     Palpations: Abdomen is soft.     Tenderness: There is no abdominal tenderness.    Musculoskeletal:        General: No swelling.     Cervical back: Neck supple.  Skin:    Findings: No rash.  Neurological:     Mental Status: He is alert and oriented to person, place, and time.  Psychiatric:        Mood and Affect: Mood normal.     ED Results / Procedures / Treatments   Labs (all labs ordered are listed, but only abnormal results are displayed) Labs Reviewed  COMPREHENSIVE METABOLIC PANEL - Abnormal; Notable for the following components:      Result Value   BUN 26 (*)    Creatinine, Ser 1.27 (*)    Calcium 8.7 (*)    Alkaline Phosphatase 34 (*)    All other components within normal limits  URINALYSIS, ROUTINE W REFLEX MICROSCOPIC - Abnormal; Notable for the following components:   APPearance HAZY (*)    All other components within normal limits  LIPASE, BLOOD  CBC  TROPONIN I (HIGH SENSITIVITY)  TROPONIN I (HIGH SENSITIVITY)    EKG EKG Interpretation  Date/Time:  Wednesday December 17 2019 04:05:03 EDT Ventricular Rate:  69 PR Interval:    QRS Duration: 111 QT Interval:  383 QTC Calculation: 411 R Axis:   -55 Text Interpretation: Sinus rhythm Incomplete RBBB and LAFB Abnormal R-wave progression, early transition Minimal ST elevation, anterior leads No significant change was found Confirmed by [id number] (Paula Libra) on 12/17/2019 4:28:28 AM   Radiology DG Chest 2 View  Result Date: 12/17/2019 CLINICAL DATA:  Heart palpitation and nausea EXAM: CHEST - 2 VIEW COMPARISON:  Seven days ago FINDINGS: The heart size and mediastinal contours are within normal limits. Both lungs are clear. The visualized skeletal structures are unremarkable. IMPRESSION: No active cardiopulmonary disease. Electronically Signed   By: 12/19/2019 M.D.   On: 12/17/2019 04:29    Procedures Procedures (including critical care time)  Medications Ordered in ED Medications  alum & mag hydroxide-simeth (MAALOX/MYLANTA) 200-200-20 MG/5ML suspension 30 mL (30 mLs Oral Given 12/17/19  0751)    And  lidocaine (XYLOCAINE) 2 % viscous mouth solution 15 mL (15 mLs Oral Given 12/17/19 0751)  sodium chloride 0.9 % bolus 1,000 mL (1,000 mLs Intravenous New Bag/Given 12/17/19 0757)    ED Course  I have reviewed the triage vital signs and the nursing notes.  Pertinent labs & imaging results that were available during my care of the patient were reviewed by me and considered in my medical decision making (see chart for details).    MDM Rules/Calculators/A&P  BP 119/76 (BP Location: Right Arm)   Pulse 65   Temp 97.9 F (36.6 C) (Oral)   Resp 15   Ht 6\' 4"  (1.93 m)   Wt 91.2 kg   SpO2 100%   BMI 24.47 kg/m   Final Clinical Impression(s) / ED Diagnoses Final diagnoses:  Heart palpitations  Acute gastritis without hemorrhage, unspecified gastritis type    Rx / DC Orders ED Discharge Orders    None     7:14 AM Patient here with chest pain palpitation.  Pain is atypical of ACS.  Patient has a hear score of 2, low risk of MACE.  He does have history of heavy alcohol use which may contribute to GERD, will give GI cocktail.  Patient was worried about potential myocarditis from recent Covid vaccine.  Fortunately, chest x-ray unremarkable, normal troponin, no serious EKG changes.  Patient does have signs of AKI with a creatinine of 1.27.  Will give IV fluid.  He endorsed intermittent nausea but none currently.  His abdomen is soft nontender.  8:51 AM Improvement with GI cocktail.  Delta troponin is negative.  Very low suspicion for ACS.  Doubt PE.  Will provide work note as requested.  Encourage patient to follow-up outpatient as dehydrated.  Cardiology referral given as needed for heart palpitation.  Return precaution discussed.  The cardiac monitor revealed sinus rhythm as interpreted by me. The cardiac monitor was ordered secondary to the patient's history of heart palpitation and to monitor the patient for dysrhythmia    ,  PA-C 12/17/19 0857    12/19/19, MD 12/17/19 2228

## 2020-01-13 NOTE — Progress Notes (Signed)
Cardiology Office Note:   Date:  01/15/2020  NAME:  Bryan Reilly    MRN: [id number] DOB:  [birth year]   PCP:  Renaye Rakers, MD  Cardiologist:  No primary care provider on file.  Electrophysiologist:  None   Referring MD: Paula Libra, MD   Chief Complaint  Patient presents with  . Chest Pain   History of Present Illness:   Bryan Reilly is a 49 y.o. male with a hx of etoh abuse who is being seen today for the evaluation of chest pain/palpitations at the request of Renaye Rakers, MD. Evaluated in the ER 12/17/2019 for chest pain and palpitations. Troponin negative x 2. No arrhythmias. EKG with iRBBB and LAD. No acute sTT changes. Referred to [place].   He reports in late June he began to have dull pain in the center of his chest.  He apparently had symptoms of chest pain all day.  There actually were better with exercise.  He reports they were improved with laying down but no positional component improved it such as leaning forward.  He reports that he had what he felt was extreme heartburn was evaluated the emergency room.  Apparently his symptoms did improve drastically with a GI cocktail.  He was given a prescription for omeprazole but he is taking this infrequently.  He reports that prior to this episode he was also a quite heavy drinker.  He reports drinking up to 1 bottle of wine and several shots of liquor per day.  He is never had any hematemesis or bleeding in his stomach that he reports to me.  He reports he works at the [employer] in [place].  He reports he can do a full day's work lifting heavy boxes and working in [employer] such as climbing stairs without any further episodes of chest pain or worsening of his symptoms.  He also reports that he can feel his heart having extra beats.  During my examination I did hear likely PVCs.  He reports that he did at the coronavirus shot he is concerned that this was possibly rated that.  No recent cough, fevers or chills.  He has no prior  history of heart disease.  He has no cardiovascular disease risk factors.  His LDL cholesterol in 2019 was 63.  He is not followed with his primary care routinely since that time.  He is not diabetic.  No recent thyroid studies either.  He is a never smoker.  He did report excessive alcohol intake but is stopped doing that since his last visit.  He reports occasional marijuana use.  He does not take any medications regularly.  Again, has not seen a physician in a number of years.  There is a questionable history of heart disease in the family but he is unsure.  Past Medical History: Past Medical History:  Diagnosis Date  . Bilateral headaches     Past Surgical History: Past Surgical History:  Procedure Laterality Date  . EYE SURGERY      Current Medications: Current Meds  Medication Sig  . acetaminophen (TYLENOL) 500 MG tablet Take 1 tablet (500 mg total) by mouth every 6 (six) hours as needed.  Marland Kitchen ibuprofen (ADVIL) 600 MG tablet Take 1 tablet (600 mg total) by mouth every 6 (six) hours as needed.  Marland Kitchen omeprazole (PRILOSEC) 20 MG capsule Take 1 capsule (20 mg total) by mouth 2 (two) times daily before a meal.  . Turmeric Curcumin 500 MG CAPS Take 500 mg by  mouth daily.     Allergies:    Patient has no known allergies.   Social History: Social History   Socioeconomic History  . Marital status: Married    Spouse name: Yolanda  . Number of children: 2  . Years of education: Not on file  . Highest education level: 12th grade  Occupational History    Employer: [employer]  Tobacco Use  . Smoking status: Never Smoker  . Smokeless tobacco: Never Used  Vaping Use  . Vaping Use: Never used  Substance and Sexual Activity  . Alcohol use: Yes  . Drug use: Yes    Types: Marijuana  . Sexual activity: Not on file  Other Topics Concern  . Not on file  Social History Narrative   Pt lives in 2 story home with his wife, 2 step children and his wife's grandmother   Has 2 children of his own    12th grade education   Worked for [employer]      Patient is is right-handed. He drinks 1 cup of coffee QOD. He recently stopped drinking sodas. He does not exercise.   Social Determinants of Health   Financial Resource Strain:   . Difficulty of Paying Living Expenses:   Food Insecurity:   . Worried About Programme researcher, broadcasting/film/video in the Last Year:   . Barista in the Last Year:   Transportation Needs:   . Freight forwarder (Medical):   Marland Kitchen Lack of Transportation (Non-Medical):   Physical Activity:   . Days of Exercise per Week:   . Minutes of Exercise per Session:   Stress:   . Feeling of Stress :   Social Connections:   . Frequency of Communication with Friends and Family:   . Frequency of Social Gatherings with Friends and Family:   . Attends Religious Services:   . Active Member of Clubs or Organizations:   . Attends Banker Meetings:   Marland Kitchen Marital Status:      Family History: The patient's family history includes CVA in his mother; Coronary artery disease in his mother; Heart disease in his father; Hypertension in his mother; Lung cancer in his father.  ROS:   All other ROS reviewed and negative. Pertinent positives noted in the HPI.     EKGs/Labs/Other Studies Reviewed:   The following studies were personally reviewed by me today:  EKG:  EKG is ordered today.  The ekg ordered today demonstrates sinus bradycardia, heart rate 53, left axis deviation noted, and was personally reviewed by me.   Recent Labs: 12/17/2019: ALT 21; BUN 26; Creatinine, Ser 1.27; Hemoglobin 13.3; Platelets 199; Potassium 4.0; Sodium 139   Recent Lipid Panel No results found for: CHOL, TRIG, HDL, CHOLHDL, VLDL, LDLCALC, LDLDIRECT  Physical Exam:   VS:  BP 122/76 (BP Location: Left Arm, Patient Position: Sitting, Cuff Size: Normal)   Pulse 53   Ht 6\' 4"  (1.93 m)   Wt 194 lb (88 kg)   BMI 23.61 kg/m    Wt Readings from Last 3 Encounters:  01/15/20 194 lb (88 kg)  12/17/19 201  lb (91.2 kg)  05/09/18 185 lb (83.9 kg)    General: Well nourished, well developed, in no acute distress Heart: Atraumatic, normal size  Eyes: PEERLA, EOMI  Neck: Supple, no JVD Endocrine: No thryomegaly Cardiac: Normal S1, S2; irregular rhythm noted Lungs: Clear to auscultation bilaterally, no wheezing, rhonchi or rales  Abd: Soft, nontender, no hepatomegaly  Ext: No edema, pulses 2+  Musculoskeletal: No deformities, BUE and BLE strength normal and equal Skin: Warm and dry, no rashes   Neuro: Alert and oriented to person, place, time, and situation, CNII-XII grossly intact, no focal deficits  Psych: Normal mood and affect   ASSESSMENT:   Deundre Thong is a 49 y.o. male who presents for the following: 1. Chest pain, unspecified type   2. Palpitations     PLAN:   1. Chest pain, unspecified type -Atypical chest pain.  Improved with GI cocktail in the emergency room.  His EKG demonstrates sinus bradycardia with left axis deviation.  There are no acute ST-T changes or evidence of prior infarction.  He ruled out for an acute coronary syndrome the emergency room.  I have suspect his symptoms are acid reflux.  I will go and prescribe him Nexium 40 mg daily.  He will take this correctly.  I am a little concerned he may have some gastritis given his heavy alcohol intake reported prior to this episode.  The pain is described as constant dull pain in the chest that lasts hours.  This is inconsistent with myocardial ischemia.  His EKG shows no ischemic changes either.  I would like for him to get a TSH as well as an echo nonetheless.  This will also help [place] better reassure him that his heart is okay.  He can resume activity.  His pain is actually lessened by activity.  2. Palpitations -He does report intermittent episodes of irregular heartbeat.  He does have this on my exam.  I suspect he is having PVCs.  We will proceed with a 3-day Zio patch she is to including significant arrhythmia.  I will also  check a TSH.  His electrolytes were normal in the emergency room.  We will see him back in 2 months after the above testing.   Disposition: Return in about 2 months (around 03/17/2020), or if symptoms worsen or fail to improve.  Medication Adjustments/Labs and Tests Ordered: Current medicines are reviewed at length with the patient today.  Concerns regarding medicines are outlined above.  Orders Placed This Encounter  Procedures  . TSH  . LONG TERM MONITOR (3-14 DAYS)  . EKG 12-Lead  . ECHOCARDIOGRAM COMPLETE   Meds ordered this encounter  Medications  . esomeprazole (NEXIUM) 40 MG capsule    Sig: Take 1 capsule (40 mg total) by mouth daily at 12 noon.    Dispense:  90 capsule    Refill:  1    Patient Instructions  Medication Instructions:  Start Nexium 40 mg daily   *If you need a refill on your cardiac medications before your next appointment, please call your pharmacy*   Lab Work: TSH today   If you have labs (blood work) drawn today and your tests are completely normal, you will receive your results only by: Marland Kitchen MyChart Message (if you have MyChart) OR . A paper copy in the mail If you have any lab test that is abnormal or we need to change your treatment, we will call you to review the results.   Testing/Procedures: Echocardiogram - Your physician has requested that you have an echocardiogram. Echocardiography is a painless test that uses sound waves to create images of your heart. It provides your doctor with information about the size and shape of your heart and how well your heart's chambers and valves are working. This procedure takes approximately one hour. There are no restrictions for this procedure. This will be performed at our [facility]  location - [address], [address].  Your physician has recommended that you wear a 3 DAY ZIO-PATCH monitor. The Zio patch cardiac monitor continuously records heart rhythm data for up to 14 days, this is for patients being  evaluated for multiple types heart rhythms. For the first 24 hours post application, please avoid getting the Zio monitor wet in the shower or by excessive sweating during exercise. After that, feel free to carry on with regular activities. Keep soaps and lotions away from the ZIO XT Patch.  This will be mailed to you, please expect 7-10 days to receive.    Applying the monitor   Shave hair from upper left chest.   Hold abrader disc by orange tab.  Rub abrader in 40 strokes over left upper chest as indicated in your monitor instructions.   Clean area with 4 enclosed alcohol pads .  Use all pads to assure are is cleaned thoroughly.  Let dry.   Apply patch as indicated in monitor instructions.  Patch will be place under collarbone on left side of chest with arrow pointing upward.   Rub patch adhesive wings for 2 minutes.Remove white label marked "1".  Remove white label marked "2".  Rub patch adhesive wings for 2 additional minutes.   While looking in a mirror, press and release button in center of patch.  A small green light will flash 3-4 times .  This will be your only indicator the monitor has been turned on.     Do not shower for the first 24 hours.  You may shower after the first 24 hours.   Press button if you feel a symptom. You will hear a small click.  Record Date, Time and Symptom in the Patient Log Book.   When you are ready to remove patch, follow instructions on last 2 pages of Patient Log Book.  Stick patch monitor onto last page of Patient Log Book.   Place Patient Log Book in [place] box.  Use locking tab on box and tape box closed securely.  The [employer] and [employer] has [employer] on it.  Please place in mailbox as soon as possible.  Your physician should have your test results approximately 7 days after the monitor has been mailed back to [facility].   Call [facility] Customer Care at [phone] if you have questions regarding your ZIO XT patch monitor.  Call  them immediately if you see an orange light blinking on your monitor.   If your monitor falls off in less than 4 days contact our Monitor department at [phone].  If your monitor becomes loose or falls off after 4 days call [employer] at [phone] for suggestions on securing your monitor     Follow-Up: At [facility], you and your health needs are our priority.  As part of our continuing mission to provide you with exceptional heart care, we have created designated Provider Care Teams.  These Care Teams include your primary Cardiologist (physician) and Advanced Practice Providers (APPs -  Physician Assistants and Nurse Practitioners) who all work together to provide you with the care you need, when you need it.  We recommend signing up for the patient portal called "MyChart".  Sign up information is provided on this After Visit Summary.  MyChart is used to connect with patients for Virtual Visits (Telemedicine).  Patients are able to view lab/test results, encounter notes, upcoming appointments, etc.  Non-urgent messages can be sent to your provider as well.  To learn more about what you can do with MyChart, go to [email or web address].    Your next appointment:   2 month(s)  The format for your next appointment:   In Person  Provider:   Lennie OdorWesley O'Neal, MD       Signed, Lenna GilfordWesley T. Flora Lipps'Neal, MD [facility]  [facility]  [address], [address] [place], [place] [address] [phone]  01/15/2020 12:34 PM

## 2020-01-15 ENCOUNTER — Encounter: Payer: Self-pay | Admitting: Cardiovascular Disease

## 2020-01-15 ENCOUNTER — Telehealth: Payer: Self-pay | Admitting: Radiology

## 2020-01-15 ENCOUNTER — Ambulatory Visit (INDEPENDENT_AMBULATORY_CARE_PROVIDER_SITE_OTHER): Payer: PRIVATE HEALTH INSURANCE | Admitting: Cardiovascular Disease

## 2020-01-15 ENCOUNTER — Other Ambulatory Visit: Payer: Self-pay

## 2020-01-15 VITALS — BP 122/76 | HR 53 | Ht 76.0 in | Wt 194.0 lb

## 2020-01-15 DIAGNOSIS — R079 Chest pain, unspecified: Secondary | ICD-10-CM

## 2020-01-15 DIAGNOSIS — R002 Palpitations: Secondary | ICD-10-CM

## 2020-01-15 LAB — TSH: TSH: 0.821 u[IU]/mL (ref 0.450–4.500)

## 2020-01-15 MED ORDER — ESOMEPRAZOLE MAGNESIUM 40 MG PO CPDR: 40.0000 mg | DELAYED_RELEASE_CAPSULE | Freq: Every day | ORAL | 1 refills | Status: DC

## 2020-01-15 NOTE — Patient Instructions (Signed)
Medication Instructions:  Start Nexium 40 mg daily   *If you need a refill on your cardiac medications before your next appointment, please call your pharmacy*   Lab Work: TSH today   If you have labs (blood work) drawn today and your tests are completely normal, you will receive your results only by: Marland Kitchen MyChart Message (if you have MyChart) OR . A paper copy in the mail If you have any lab test that is abnormal or we need to change your treatment, we will call you to review the results.   Testing/Procedures: Echocardiogram - Your physician has requested that you have an echocardiogram. Echocardiography is a painless test that uses sound waves to create images of your heart. It provides your doctor with information about the size and shape of your heart and how well your heart's chambers and valves are working. This procedure takes approximately one hour. There are no restrictions for this procedure. This will be performed at our [facility] location - [address], [address].  Your physician has recommended that you wear a 3 DAY ZIO-PATCH monitor. The Zio patch cardiac monitor continuously records heart rhythm data for up to 14 days, this is for patients being evaluated for multiple types heart rhythms. For the first 24 hours post application, please avoid getting the Zio monitor wet in the shower or by excessive sweating during exercise. After that, feel free to carry on with regular activities. Keep soaps and lotions away from the ZIO XT Patch.  This will be mailed to you, please expect 7-10 days to receive.    Applying the monitor   Shave hair from upper left chest.   Hold abrader disc by orange tab.  Rub abrader in 40 strokes over left upper chest as indicated in your monitor instructions.   Clean area with 4 enclosed alcohol pads .  Use all pads to assure are is cleaned thoroughly.  Let dry.   Apply patch as indicated in monitor instructions.  Patch will be place under collarbone  on left side of chest with arrow pointing upward.   Rub patch adhesive wings for 2 minutes.Remove white label marked "1".  Remove white label marked "2".  Rub patch adhesive wings for 2 additional minutes.   While looking in a mirror, press and release button in center of patch.  A small green light will flash 3-4 times .  This will be your only indicator the monitor has been turned on.     Do not shower for the first 24 hours.  You may shower after the first 24 hours.   Press button if you feel a symptom. You will hear a small click.  Record Date, Time and Symptom in the Patient Log Book.   When you are ready to remove patch, follow instructions on last 2 pages of Patient Log Book.  Stick patch monitor onto last page of Patient Log Book.   Place Patient Log Book in [place] box.  Use locking tab on box and tape box closed securely.  The [employer] and [employer] has [employer] on it.  Please place in mailbox as soon as possible.  Your physician should have your test results approximately 7 days after the monitor has been mailed back to [facility].   Call [facility] Customer Care at [phone] if you have questions regarding your ZIO XT patch monitor.  Call them immediately if you see an orange light blinking on your monitor.   If your monitor  falls off in less than 4 days contact our Monitor department at [phone].  If your monitor becomes loose or falls off after 4 days call [employer] at [phone] for suggestions on securing your monitor     Follow-Up: At [facility], you and your health needs are our priority.  As part of our continuing mission to provide you with exceptional heart care, we have created designated Provider Care Teams.  These Care Teams include your primary Cardiologist (physician) and Advanced Practice Providers (APPs -  Physician Assistants and Nurse Practitioners) who all work together to provide you with the care you need, when you need it.  We  recommend signing up for the patient portal called "MyChart".  Sign up information is provided on this After Visit Summary.  MyChart is used to connect with patients for Virtual Visits (Telemedicine).  Patients are able to view lab/test results, encounter notes, upcoming appointments, etc.  Non-urgent messages can be sent to your provider as well.   To learn more about what you can do with MyChart, go to [email or web address].    Your next appointment:   2 month(s)  The format for your next appointment:   In Person  Provider:   Lennie Odor, MD

## 2020-01-15 NOTE — Telephone Encounter (Signed)
Enrolled patient for a 3 day Zio monitor to be mailed to patients home.  

## 2020-01-21 ENCOUNTER — Other Ambulatory Visit (INDEPENDENT_AMBULATORY_CARE_PROVIDER_SITE_OTHER): Payer: PRIVATE HEALTH INSURANCE

## 2020-01-21 DIAGNOSIS — R002 Palpitations: Secondary | ICD-10-CM

## 2020-01-27 ENCOUNTER — Other Ambulatory Visit: Payer: Self-pay

## 2020-01-27 ENCOUNTER — Ambulatory Visit (HOSPITAL_COMMUNITY): Payer: PRIVATE HEALTH INSURANCE | Attending: Cardiology

## 2020-01-27 DIAGNOSIS — R079 Chest pain, unspecified: Secondary | ICD-10-CM

## 2020-01-27 LAB — ECHOCARDIOGRAM COMPLETE
Area-P 1/2: 3.46 cm2
S' Lateral: 3.6 cm

## 2020-02-04 ENCOUNTER — Telehealth: Payer: Self-pay | Admitting: Cardiovascular Disease

## 2020-02-04 ENCOUNTER — Other Ambulatory Visit: Payer: Self-pay

## 2020-02-04 MED ORDER — METOPROLOL SUCCINATE ER 25 MG PO TB24
25.0000 mg | ORAL_TABLET | Freq: Every day | ORAL | 1 refills | Status: DC
Start: 2020-02-04 — End: 2020-03-01

## 2020-02-04 NOTE — Telephone Encounter (Signed)
Transferred call to Julie  

## 2020-02-04 NOTE — Telephone Encounter (Signed)
I called patient, spoke with him regarding results.   Thank you!

## 2020-02-04 NOTE — Telephone Encounter (Signed)
° ° °  Pt is returning call from [place] for his heart monitor results

## 2020-02-04 NOTE — Telephone Encounter (Signed)
Attempted to contact pt to review results. Unable to leave message on pts phone due to mailbox being full.

## 2020-03-01 ENCOUNTER — Telehealth: Payer: Self-pay | Admitting: Cardiovascular Disease

## 2020-03-01 ENCOUNTER — Other Ambulatory Visit: Payer: Self-pay

## 2020-03-01 MED ORDER — DILTIAZEM HCL ER 120 MG PO CP24: 120.0000 mg | ORAL_CAPSULE | Freq: Every day | ORAL | 1 refills | Status: DC

## 2020-03-01 NOTE — Telephone Encounter (Signed)
Called in RX. Attempted to contact patient to notify that medication was sent to pharmacy.  LVM, advising of message and to call back with any questions/concerns

## 2020-03-01 NOTE — Telephone Encounter (Signed)
Change to diltiazem XR 120 mg daily.   Gerri Spore T. Flora Lipps, MD [facility]  [address], [address] [place], [place] [address] [phone]  12:23 PM

## 2020-03-01 NOTE — Telephone Encounter (Signed)
  Pt c/o medication issue:  1. Name of Medication: metoprolol succinate (TOPROL XL) 25 MG 24 hr tablet  2. How are you currently taking this medication (dosage and times per day)? Take 1 tablet (25 mg total) by mouth daily.  3. Are you having a reaction (difficulty breathing--STAT)?  No  4. What is your medication issue? Patient states that the medication is working but he is now having issues with erectile dysfunction and would like to discuss his options. He said he may be at work and unable to answer a callback but it is okay to leave a detailed message.

## 2020-03-23 ENCOUNTER — Ambulatory Visit: Payer: PRIVATE HEALTH INSURANCE | Admitting: Cardiovascular Disease

## 2020-04-05 NOTE — Progress Notes (Deleted)
Cardiology Office Note:   Date:  04/05/2020  NAME:  Bryan Reilly    MRN: [id number] DOB:  [birth year]   PCP:  Renaye Rakers, MD  Cardiologist:  No primary care provider on file.  Electrophysiologist:  None   Referring MD: Renaye Rakers, MD   No chief complaint on file. ***  History of Present Illness:   Bryan Reilly is a 49 y.o. male with a hx of palpitations, etoh abuse who presents for follow-up. Found to have PACs on monitor. Started on metoprolol but experienced ED. Put on diltiazem. Echo normal. Started on nexium.   Problem List 1. PACs -5.4% burden -EF 60-65%  Past Medical History: Past Medical History:  Diagnosis Date  . Bilateral headaches     Past Surgical History: Past Surgical History:  Procedure Laterality Date  . EYE SURGERY      Current Medications: No outpatient medications have been marked as taking for the 04/08/20 encounter (Appointment) with O'Neal, Ronnald Ramp, MD.     Allergies:    Patient has no known allergies.   Social History: Social History   Socioeconomic History  . Marital status: Married    Spouse name: Yolanda  . Number of children: 2  . Years of education: Not on file  . Highest education level: 12th grade  Occupational History    Employer: [employer]  Tobacco Use  . Smoking status: Never Smoker  . Smokeless tobacco: Never Used  Vaping Use  . Vaping Use: Never used  Substance and Sexual Activity  . Alcohol use: Yes  . Drug use: Yes    Types: Marijuana  . Sexual activity: Not on file  Other Topics Concern  . Not on file  Social History Narrative   Pt lives in 2 story home with his wife, 2 step children and his wife's grandmother   Has 2 children of his own   12th grade education   Worked for [employer]      Patient is is right-handed. He drinks 1 cup of coffee QOD. He recently stopped drinking sodas. He does not exercise.   Social Determinants of Health   Financial Resource Strain:   . Difficulty of Paying Living  Expenses: Not on file  Food Insecurity:   . Worried About Programme researcher, broadcasting/film/video in the Last Year: Not on file  . Ran Out of Food in the Last Year: Not on file  Transportation Needs:   . Lack of Transportation (Medical): Not on file  . Lack of Transportation (Non-Medical): Not on file  Physical Activity:   . Days of Exercise per Week: Not on file  . Minutes of Exercise per Session: Not on file  Stress:   . Feeling of Stress : Not on file  Social Connections:   . Frequency of Communication with Friends and Family: Not on file  . Frequency of Social Gatherings with Friends and Family: Not on file  . Attends Religious Services: Not on file  . Active Member of Clubs or Organizations: Not on file  . Attends Banker Meetings: Not on file  . Marital Status: Not on file     Family History: The patient's ***family history includes CVA in his mother; Coronary artery disease in his mother; Heart disease in his father; Hypertension in his mother; Lung cancer in his father.  ROS:   All other ROS reviewed and negative. Pertinent positives noted in the HPI.     EKGs/Labs/Other Studies Reviewed:   The following studies were personally reviewed  by me today:  EKG:  EKG is *** ordered today.  The ekg ordered today demonstrates ***, and was personally reviewed by me.   Zio 01/21/2020 Impression:  1. 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 2. No atrial fibrillation.     TTE 01/27/2020 1. Left ventricular ejection fraction, by estimation, is 60 to 65%. The  left ventricle has normal function. The left ventricle has no regional  wall motion abnormalities. Left ventricular diastolic parameters were  normal.  2. Right ventricular systolic function is normal. The right ventricular  size is normal. There is normal pulmonary artery systolic pressure.  3. Left atrial size was mildly dilated.  4. Right atrial size was mildly dilated.  5. The mitral valve is normal in structure. Trivial mitral  valve  regurgitation. No evidence of mitral stenosis.  6. The aortic valve is tricuspid. Aortic valve regurgitation is not  visualized. Mild aortic valve sclerosis is present, with no evidence of  aortic valve stenosis.  7. The inferior vena cava is normal in size with greater than 50%  respiratory variability, suggesting right atrial pressure of 3 mmHg.   Recent Labs: 12/17/2019: ALT 21; BUN 26; Creatinine, Ser 1.27; Hemoglobin 13.3; Platelets 199; Potassium 4.0; Sodium 139 01/15/2020: TSH 0.821   Recent Lipid Panel No results found for: CHOL, TRIG, HDL, CHOLHDL, VLDL, LDLCALC, LDLDIRECT  Physical Exam:   VS:  There were no vitals taken for this visit.   Wt Readings from Last 3 Encounters:  01/15/20 194 lb (88 kg)  12/17/19 201 lb (91.2 kg)  05/09/18 185 lb (83.9 kg)    General: Well nourished, well developed, in no acute distress Heart: Atraumatic, normal size  Eyes: PEERLA, EOMI  Neck: Supple, no JVD Endocrine: No thryomegaly Cardiac: Normal S1, S2; RRR; no murmurs, rubs, or gallops Lungs: Clear to auscultation bilaterally, no wheezing, rhonchi or rales  Abd: Soft, nontender, no hepatomegaly  Ext: No edema, pulses 2+ Musculoskeletal: No deformities, BUE and BLE strength normal and equal Skin: Warm and dry, no rashes   Neuro: Alert and oriented to person, place, time, and situation, CNII-XII grossly intact, no focal deficits  Psych: Normal mood and affect   ASSESSMENT:   Bryan Reilly is a 49 y.o. male who presents for the following: No diagnosis found.  PLAN:   There are no diagnoses linked to this encounter.  Disposition: No follow-ups on file.  Medication Adjustments/Labs and Tests Ordered: Current medicines are reviewed at length with the patient today.  Concerns regarding medicines are outlined above.  No orders of the defined types were placed in this encounter.  No orders of the defined types were placed in this encounter.   There are no Patient  Instructions on file for this visit.   Time Spent with Patient: I have spent a total of *** minutes with patient reviewing hospital notes, telemetry, EKGs, labs and examining the patient as well as establishing an assessment and plan that was discussed with the patient.  > 50% of time was spent in direct patient care.  Signed, Lenna Gilford. Flora Lipps, MD [facility]  [address] [place], [place] [address] [phone]  04/05/2020 7:03 PM

## 2020-04-08 ENCOUNTER — Ambulatory Visit: Payer: PRIVATE HEALTH INSURANCE | Admitting: Cardiovascular Disease

## 2020-04-08 DIAGNOSIS — I491 Atrial premature depolarization: Secondary | ICD-10-CM

## 2020-04-08 DIAGNOSIS — R002 Palpitations: Secondary | ICD-10-CM

## 2020-04-08 DIAGNOSIS — R079 Chest pain, unspecified: Secondary | ICD-10-CM

## 2020-04-13 NOTE — Progress Notes (Signed)
Cardiology Office Note:   Date:  04/15/2020  NAME:  Bryan Reilly    MRN: [id number] DOB:  [birth year]   PCP:  Renaye Rakers, MD  Cardiologist:  No primary care provider on file.   Referring MD: Renaye Rakers, MD   Chief Complaint  Patient presents with  . Follow-up   History of Present Illness:   Bryan Reilly is a 49 y.o. male with a hx of etoh use who presents for follow-up of palpitations. Zio with PACs 5.4%.  He was started on diltiazem.  He reports he notices infrequent symptoms now.  He reports 1-2 times a week he may notice an extra heartbeat or 2.  Apparently symptoms last seconds.  They are greatly improved.  He has cut back considerably on alcohol use.  He was drinking daily but now only drinking every few days.  He is only drinking wine.  He reports improvement with this as well.  He denies any chest pain or shortness of breath.  He works at the [employer].  He can climb stairs and do pretty heavy physical activity without any limitations.  I did inquire about sleep apnea.  He does report that he snores.  He also describes excessive fatigue during the day.  Thyroid studies have been normal.  Given these had excessive PACs sleep apnea could be an issue.  I recommended home sleep study.  He is okay to do this.  Blood pressure is 118/62.  His most recent lipid profile shows a total cholesterol of 131, HDL 54, LDL 63, triglycerides 64.  Past Medical History: Past Medical History:  Diagnosis Date  . Bilateral headaches     Past Surgical History: Past Surgical History:  Procedure Laterality Date  . EYE SURGERY      Current Medications: No outpatient medications have been marked as taking for the 04/15/20 encounter (Office Visit) with Sande Rives, MD.     Allergies:    Patient has no known allergies.   Social History: Social History   Socioeconomic History  . Marital status: Married    Spouse name: Yolanda  . Number of children: 2  . Years of education: Not  on file  . Highest education level: 12th grade  Occupational History    Employer: [employer]  Tobacco Use  . Smoking status: Never Smoker  . Smokeless tobacco: Never Used  Vaping Use  . Vaping Use: Never used  Substance and Sexual Activity  . Alcohol use: Yes  . Drug use: Yes    Types: Marijuana  . Sexual activity: Not on file  Other Topics Concern  . Not on file  Social History Narrative   Pt lives in 2 story home with his wife, 2 step children and his wife's grandmother   Has 2 children of his own   12th grade education   Worked for [employer]      Patient is is right-handed. He drinks 1 cup of coffee QOD. He recently stopped drinking sodas. He does not exercise.   Social Determinants of Health   Financial Resource Strain:   . Difficulty of Paying Living Expenses: Not on file  Food Insecurity:   . Worried About Programme researcher, broadcasting/film/video in the Last Year: Not on file  . Ran Out of Food in the Last Year: Not on file  Transportation Needs:   . Lack of Transportation (Medical): Not on file  . Lack of Transportation (Non-Medical): Not on file  Physical Activity:   . Days of Exercise  per Week: Not on file  . Minutes of Exercise per Session: Not on file  Stress:   . Feeling of Stress : Not on file  Social Connections:   . Frequency of Communication with Friends and Family: Not on file  . Frequency of Social Gatherings with Friends and Family: Not on file  . Attends Religious Services: Not on file  . Active Member of Clubs or Organizations: Not on file  . Attends Banker Meetings: Not on file  . Marital Status: Not on file     Family History: The patient's family history includes CVA in his mother; Coronary artery disease in his mother; Heart disease in his father; Hypertension in his mother; Lung cancer in his father.  ROS:   All other ROS reviewed and negative. Pertinent positives noted in the HPI.     EKGs/Labs/Other Studies Reviewed:   The following studies were  personally reviewed by me today:  TTE 01/27/2020 1. Left ventricular ejection fraction, by estimation, is 60 to 65%. The  left ventricle has normal function. The left ventricle has no regional  wall motion abnormalities. Left ventricular diastolic parameters were  normal.  2. Right ventricular systolic function is normal. The right ventricular  size is normal. There is normal pulmonary artery systolic pressure.  3. Left atrial size was mildly dilated.  4. Right atrial size was mildly dilated.  5. The mitral valve is normal in structure. Trivial mitral valve  regurgitation. No evidence of mitral stenosis.  6. The aortic valve is tricuspid. Aortic valve regurgitation is not  visualized. Mild aortic valve sclerosis is present, with no evidence of  aortic valve stenosis.  7. The inferior vena cava is normal in size with greater than 50%  respiratory variability, suggesting right atrial pressure of 3 mmHg.   Zio 01/21/2020 1. 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 2. No atrial fibrillation.     Recent Labs: 12/17/2019: ALT 21; BUN 26; Creatinine, Ser 1.27; Hemoglobin 13.3; Platelets 199; Potassium 4.0; Sodium 139 01/15/2020: TSH 0.821   Recent Lipid Panel No results found for: CHOL, TRIG, HDL, CHOLHDL, VLDL, LDLCALC, LDLDIRECT  Physical Exam:   VS:  BP 118/62   Pulse (!) 56   Ht 6\' 4"  (1.93 m)   Wt 204 lb 6.4 oz (92.7 kg)   SpO2 98%   BMI 24.88 kg/m    Wt Readings from Last 3 Encounters:  04/15/20 204 lb 6.4 oz (92.7 kg)  01/15/20 194 lb (88 kg)  12/17/19 201 lb (91.2 kg)    General: Well nourished, well developed, in no acute distress Heart: Atraumatic, normal size  Eyes: PEERLA, EOMI  Neck: Supple, no JVD Endocrine: No thryomegaly Cardiac: Normal S1, S2; RRR; no murmurs, rubs, or gallops Lungs: Clear to auscultation bilaterally, no wheezing, rhonchi or rales  Abd: Soft, nontender, no hepatomegaly  Ext: No edema, pulses 2+ Musculoskeletal: No deformities, BUE and BLE  strength normal and equal Skin: Warm and dry, no rashes   Neuro: Alert and oriented to person, place, time, and situation, CNII-XII grossly intact, no focal deficits  Psych: Normal mood and affect   ASSESSMENT:   Bryan Reilly is a 49 y.o. male who presents for the following: 1. Palpitations   2. PAC (premature atrial contraction)   3. Snoring   4. Fatigue, unspecified type     PLAN:   1. Palpitations 2. PAC (premature atrial contraction) -Symptomatic PACs.  Improved on diltiazem.  We will continue this.  I suspect the trigger was alcohol  related.  Possibly sleep apnea.  See discussion about sleep study below.  We will continue his diltiazem extended release 120 mg daily.  Echo without evidence of structural heart disease.  I will see him back yearly.  3. Snoring 4. Fatigue, unspecified type -He reports he does snore at night.  He is tired during the day.  He is having frequent PACs.  We will proceed with home sleep study.  Disposition: Return in about 1 year (around 04/15/2021).  Medication Adjustments/Labs and Tests Ordered: Current medicines are reviewed at length with the patient today.  Concerns regarding medicines are outlined above.  Orders Placed This Encounter  Procedures  . Home sleep test   Meds ordered this encounter  Medications  . diltiazem (DILACOR XR) 120 MG 24 hr capsule    Sig: Take 1 capsule (120 mg total) by mouth daily.    Dispense:  90 capsule    Refill:  3    Patient Instructions  Medication Instructions:  Continue current medications  *If you need a refill on your cardiac medications before your next appointment, please call your pharmacy*   Lab Work: None ordered    Testing/Procedures: Your physician has recommended that you have a home sleep study. This test records several body functions during sleep, including: brain activity, eye movement, oxygen and carbon dioxide blood levels, heart rate and rhythm, breathing rate and rhythm, the flow  of air through your mouth and nose, snoring, body muscle movements, and chest and belly movement.   Follow-Up: At [facility], you and your health needs are our priority.  As part of our continuing mission to provide you with exceptional heart care, we have created designated Provider Care Teams.  These Care Teams include your primary Cardiologist (physician) and Advanced Practice Providers (APPs -  Physician Assistants and Nurse Practitioners) who all work together to provide you with the care you need, when you need it.  We recommend signing up for the patient portal called "MyChart".  Sign up information is provided on this After Visit Summary.  MyChart is used to connect with patients for Virtual Visits (Telemedicine).  Patients are able to view lab/test results, encounter notes, upcoming appointments, etc.  Non-urgent messages can be sent to your provider as well.   To learn more about what you can do with MyChart, go to [email or web address].    Your next appointment:   1 year(s)  The format for your next appointment:   In Person  Provider:   You may see Melburn HakeJennette Kettle, MD or one of the following Advanced Practice Providers on your designated Care Team:    Azalee Course, PA-C  Micah Flesher, [place] or   Judy Pimple, PA-C        Time Spent with Patient: I have spent a total of 35 minutes with patient reviewing hospital notes, telemetry, EKGs, labs and examining the patient as well as establishing an assessment and plan that was discussed with the patient.  > 50% of time was spent in direct patient care.  Signed, Lenna Gilford. Flora Lipps, MD [facility]  [address], [address] [place], [place] [address] [phone]  04/15/2020 9:34 AM

## 2020-04-15 ENCOUNTER — Ambulatory Visit (INDEPENDENT_AMBULATORY_CARE_PROVIDER_SITE_OTHER): Payer: PRIVATE HEALTH INSURANCE | Admitting: Cardiovascular Disease

## 2020-04-15 ENCOUNTER — Other Ambulatory Visit: Payer: Self-pay

## 2020-04-15 ENCOUNTER — Encounter: Payer: Self-pay | Admitting: Cardiovascular Disease

## 2020-04-15 VITALS — BP 118/62 | HR 56 | Ht 76.0 in | Wt 204.4 lb

## 2020-04-15 DIAGNOSIS — I491 Atrial premature depolarization: Secondary | ICD-10-CM | POA: Diagnosis not present

## 2020-04-15 DIAGNOSIS — R002 Palpitations: Secondary | ICD-10-CM | POA: Diagnosis not present

## 2020-04-15 DIAGNOSIS — R5383 Other fatigue: Secondary | ICD-10-CM | POA: Diagnosis not present

## 2020-04-15 DIAGNOSIS — R0683 Snoring: Secondary | ICD-10-CM | POA: Diagnosis not present

## 2020-04-15 MED ORDER — DILTIAZEM HCL ER 120 MG PO CP24: 120.0000 mg | ORAL_CAPSULE | Freq: Every day | ORAL | 3 refills | Status: DC

## 2020-04-15 NOTE — Patient Instructions (Signed)
Medication Instructions:  Continue current medications  *If you need a refill on your cardiac medications before your next appointment, please call your pharmacy*   Lab Work: None ordered    Testing/Procedures: Your physician has recommended that you have a home sleep study. This test records several body functions during sleep, including: brain activity, eye movement, oxygen and carbon dioxide blood levels, heart rate and rhythm, breathing rate and rhythm, the flow of air through your mouth and nose, snoring, body muscle movements, and chest and belly movement.   Follow-Up: At [facility], you and your health needs are our priority.  As part of our continuing mission to provide you with exceptional heart care, we have created designated Provider Care Teams.  These Care Teams include your primary Cardiologist (physician) and Advanced Practice Providers (APPs -  Physician Assistants and Nurse Practitioners) who all work together to provide you with the care you need, when you need it.  We recommend signing up for the patient portal called "MyChart".  Sign up information is provided on this After Visit Summary.  MyChart is used to connect with patients for Virtual Visits (Telemedicine).  Patients are able to view lab/test results, encounter notes, upcoming appointments, etc.  Non-urgent messages can be sent to your provider as well.   To learn more about what you can do with MyChart, go to [email or web address].    Your next appointment:   1 year(s)  The format for your next appointment:   In Person  Provider:   You may see Lennie Odor, MD or one of the following Advanced Practice Providers on your designated Care Team:    Azalee Course, PA-C  Micah Flesher, PA-C or   Judy Pimple, [place]

## 2020-04-20 ENCOUNTER — Telehealth: Payer: Self-pay | Admitting: Cardiovascular Disease

## 2020-04-20 NOTE — Telephone Encounter (Signed)
No PA required for HST.  Waiting on sleep lab to call me back to schedule. °

## 2020-04-21 NOTE — Telephone Encounter (Signed)
HST set for Friday, Nov. 19 at [facility] 7:15 pm.  I called patient and left a detailed message about the appt and gave him the sleep lab phone number.

## 2020-09-12 ENCOUNTER — Other Ambulatory Visit: Payer: Self-pay

## 2020-09-12 ENCOUNTER — Encounter (HOSPITAL_COMMUNITY): Payer: Self-pay

## 2020-09-12 ENCOUNTER — Emergency Department (HOSPITAL_COMMUNITY)
Admission: EM | Admit: 2020-09-12 | Discharge: 2020-09-13 | Disposition: A | Discharge status: Home / Self Care | Payer: 59 | Attending: Emergency Medicine | Admitting: Emergency Medicine

## 2020-09-12 DIAGNOSIS — F1290 Cannabis use, unspecified, uncomplicated: Secondary | ICD-10-CM | POA: Diagnosis not present

## 2020-09-12 DIAGNOSIS — R0602 Shortness of breath: Secondary | ICD-10-CM | POA: Diagnosis present

## 2020-09-12 DIAGNOSIS — R002 Palpitations: Secondary | ICD-10-CM | POA: Insufficient documentation

## 2020-09-12 MED ORDER — SODIUM CHLORIDE 0.9 % IV BOLUS
1000.0000 mL | Freq: Once | INTRAVENOUS | Status: AC
  Administered 2020-09-12: 1000 mL via INTRAVENOUS

## 2020-09-12 NOTE — ED Triage Notes (Signed)
Pt presents via PTAR, states he ate a marijuana brownie around 1900 and hasn't felt right since. Also admits to having 2 shots. States he does not usually consume marijuana

## 2020-09-12 NOTE — ED Provider Notes (Signed)
[facility]-EMERGENCY DEPT Provider Note   CSN: [id number] Arrival date & time: 09/12/20  2239     History Chief Complaint  Patient presents with  . Drug Problem    Bryan Reilly No is a 50 y.o. male.  Patient to ED with symptoms of fast rate heart palpitations and mild SOB that started about 1 1/2 hours after eating a homemade marijuana brownie he ate around 7:00 pm tonight. No nausea, vomiting, syncope, near syncope, chest or other pain, or headache. He denies significant medical history. No new medications. He also reports having 2 shots of alcohol.   The history is provided by the patient. No language interpreter was used.  Drug Problem Associated symptoms include shortness of breath. Pertinent negatives include no chest pain, no abdominal pain and no headaches.       Past Medical History:  Diagnosis Date  . Bilateral headaches     There are no problems to display for this patient.   Past Surgical History:  Procedure Laterality Date  . EYE SURGERY         Family History  Problem Relation Age of Onset  . CVA Mother   . Hypertension Mother   . Coronary artery disease Mother   . Lung cancer Father   . Heart disease Father     Social History   Tobacco Use  . Smoking status: Never Smoker  . Smokeless tobacco: Never Used  Vaping Use  . Vaping Use: Never used  Substance Use Topics  . Alcohol use: Yes  . Drug use: Yes    Types: Marijuana    Home Medications Prior to Admission medications   Medication Sig Start Date End Date Taking? Authorizing Provider  acetaminophen (TYLENOL) 500 MG tablet Take 1 tablet (500 mg total) by mouth every 6 (six) hours as needed. 12/10/19   Luevenia Maxin, Mina A, PA-C  diltiazem (DILACOR XR) 120 MG 24 hr capsule Take 1 capsule (120 mg total) by mouth daily. 04/15/20   O'NealRonnald Ramp, MD  esomeprazole (NEXIUM) 40 MG capsule Take 1 capsule (40 mg total) by mouth daily at 12 noon. 01/15/20   Sande Rives,  MD  ibuprofen (ADVIL) 600 MG tablet Take 1 tablet (600 mg total) by mouth every 6 (six) hours as needed. 12/10/19   Luevenia Maxin, Mina A, PA-C  omeprazole (PRILOSEC) 20 MG capsule Take 1 capsule (20 mg total) by mouth 2 (two) times daily before a meal. 12/17/19   Fayrene Helper, PA-C  Turmeric Curcumin 500 MG CAPS Take 500 mg by mouth daily.    [provider]    Allergies    Patient has no known allergies.  Review of Systems   Review of Systems  Constitutional: Negative for chills and fever.  HENT: Negative.   Respiratory: Positive for shortness of breath.   Cardiovascular: Positive for palpitations. Negative for chest pain.  Gastrointestinal: Negative.  Negative for abdominal pain and nausea.  Musculoskeletal: Negative.   Skin: Negative.   Neurological: Negative.  Negative for syncope and headaches.  Psychiatric/Behavioral: Negative for hallucinations.    Physical Exam Updated Vital Signs BP (!) 132/58 (BP Location: Left Arm)   Pulse 73   Temp 98.2 F (36.8 C)   Resp 16   Ht 6\' 4"  (1.93 m)   Wt 92.7 kg   SpO2 99%   BMI 24.88 kg/m   Physical Exam Vitals and nursing note reviewed.  Constitutional:      General: He is not in acute distress.  Appearance: He is well-developed.     Comments: Slowed speech, lying with eyes closed, appears drowsy.  HENT:     Head: Normocephalic.  Cardiovascular:     Rate and Rhythm: Normal rate and regular rhythm.     Heart sounds: No murmur heard.   Pulmonary:     Effort: Pulmonary effort is normal.     Breath sounds: Normal breath sounds. No wheezing, rhonchi or rales.  Abdominal:     General: Bowel sounds are normal.     Palpations: Abdomen is soft.     Tenderness: There is no abdominal tenderness. There is no guarding or rebound.  Musculoskeletal:        General: Normal range of motion.     Cervical back: Normal range of motion and neck supple.  Skin:    General: Skin is warm and dry.     Findings: No rash.  Neurological:      Mental Status: He is alert and oriented to person, place, and time.     ED Results / Procedures / Treatments   Labs (all labs ordered are listed, but only abnormal results are displayed) Labs Reviewed - No data to display  EKG None  Radiology No results found.  Procedures Procedures   Medications Ordered in ED Medications - No data to display  ED Course  I have reviewed the triage vital signs and the nursing notes.  Pertinent labs & imaging results that were available during my care of the patient were reviewed by me and considered in my medical decision making (see chart for details).    MDM Rules/Calculators/A&P                          Patient to the ED with c/o heart racing, "feel out of it" after eating a marijuana brownie earlier this evening.   He appears drowsy but answer questions. EKG normal sinus. Chemistries unremarkable. UDS +marijuana only.   He has ambulated to the bathroom without imbalance. VSS. He can be discharged home.   Final Clinical Impression(s) / ED Diagnoses Final diagnoses:  None   1. Marijuana use  Rx / DC Orders ED Discharge Orders    None       Bryan Reilly 09/13/20 0117    Bryan Kaplan, MD 09/23/20 0028

## 2020-09-13 LAB — BASIC METABOLIC PANEL
Anion gap: 9 (ref 5–15)
BUN: 14 mg/dL (ref 6–20)
CO2: 24 mmol/L (ref 22–32)
Calcium: 8.3 mg/dL — ABNORMAL LOW (ref 8.9–10.3)
Chloride: 106 mmol/L (ref 98–111)
Creatinine, Ser: 1.03 mg/dL (ref 0.61–1.24)
GFR, Estimated: >60 mL/min (ref >60)
Glucose, Bld: 165 mg/dL — ABNORMAL HIGH (ref 70–99)
Potassium: 3.5 mmol/L (ref 3.5–5.1)
Sodium: 139 mmol/L (ref 135–145)

## 2020-09-13 LAB — RAPID URINE DRUG SCREEN, HOSP PERFORMED
Amphetamines: NOT DETECTED
Barbiturates: NOT DETECTED
Benzodiazepines: NOT DETECTED
Cocaine: NOT DETECTED
Opiates: NOT DETECTED
Tetrahydrocannabinol: POSITIVE — AB

## 2020-09-13 NOTE — ED Notes (Signed)
Pt ambulatory to restroom unassisted  

## 2020-09-13 NOTE — Discharge Instructions (Addendum)
Followup with your doctor as needed

## 2021-05-26 ENCOUNTER — Ambulatory Visit: Payer: PRIVATE HEALTH INSURANCE | Admitting: Nurse Practitioner

## 2021-09-15 NOTE — Progress Notes (Signed)
?Cardiology Clinic Note  ? ?Patient Name: Bryan Reilly ?Date of Encounter: 09/16/2021 ? ?Primary Care Provider:  Renaye RakersBland, Veita, MD ?Primary Cardiologist:  None ? ?Patient Profile  ?  ?51 year old male with history of ETOH abuse, and illicit drug use, seen in ED for complaints of rapid HR and dyspnea after eating a homemade marijuana brownie and drinking two shots of alcohol. While in ED he was ruled out for ACS by labs, EKG. He was released. Recent echo in 2021 was normal, EF of 60-65%.  ? ?Past Medical History  ?  ?Past Medical History:  ?Diagnosis Date  ? Bilateral headaches   ? ?Past Surgical History:  ?Procedure Laterality Date  ? EYE SURGERY    ? ? ?Allergies ? ?Allergies  ?Allergen Reactions  ? Bee Venom Anaphylaxis  ? Metoprolol   ? ? ?History of Present Illness  ?  ?We are seeing Bryan Reilly s/p ED evaluation for shortness of breath and palpitations after ingesting marijuana and ETOH. When last seen by Dr. Flora Lipps'Neal he was scheduled for echo and Zio monitor with essential normal results.  He was recommended for home sleep study. Has not been seen since 2021.  ? ?He comes today after being seen by primary gastroenterologist.  There is a plan to have an EGD completed after cardiology followed up with him post ED visit.  He is now on a PPI which has been very helpful to him, Protonix 40 mg daily.  He occasionally has complaints of palpitations although they are rare and usually occur after he works out. ? ?The patient runs on a treadmill a minimum of 2 days a week for about 20 minutes without any complaints of chest pain, dizziness, has normal shortness of breath with exercise but quickly regains breathing normality post exercise with occasional palpitations during cooling off.  He remains active during the week. ? ?Home Medications  ?  ?Current Outpatient Medications  ?Medication Sig Dispense Refill  ? acetaminophen (TYLENOL) 500 MG tablet Take 1 tablet (500 mg total) by mouth every 6 (six) hours as needed. 30  tablet 0  ? pantoprazole (PROTONIX) 40 MG tablet Take 40 mg by mouth every morning.    ? diltiazem (DILACOR XR) 120 MG 24 hr capsule Take 1 capsule (120 mg total) by mouth daily. (Patient not taking: Reported on 09/13/2020) 90 capsule 3  ? ?No current facility-administered medications for this visit.  ?  ? ?Family History  ?  ?Family History  ?Problem Relation Age of Onset  ? CVA Mother   ? Hypertension Mother   ? Coronary artery disease Mother   ? Lung cancer Father   ? Heart disease Father   ? ?He indicated that his mother is alive. He indicated that his father is deceased. He indicated that his sister is alive. He indicated that his brother is alive. ? ?Social History  ?  ?Social History  ? ?Socioeconomic History  ? Marital status: Married  ?  Spouse name: Yolanda  ? Number of children: 2  ? Years of education: Not on file  ? Highest education level: 12th grade  ?Occupational History  ?  Employer: [employer]  ?Tobacco Use  ? Smoking status: Never  ? Smokeless tobacco: Never  ?Vaping Use  ? Vaping Use: Never used  ?Substance and Sexual Activity  ? Alcohol use: Yes  ? Drug use: Yes  ?  Types: Marijuana  ? Sexual activity: Not on file  ?Other Topics Concern  ? Not on file  ?  Social History Narrative  ? Pt lives in 2 story home with his wife, 2 step children and his wife's grandmother  ? Has 2 children of his own  ? 12th grade education  ? Worked for [employer]  ?   ? Patient is is right-handed. He drinks 1 cup of coffee QOD. He recently stopped drinking sodas. He does not exercise.  ? ?Social Determinants of Health  ? ?Financial Resource Strain: Not on file  ?Food Insecurity: Not on file  ?Transportation Needs: Not on file  ?Physical Activity: Not on file  ?Stress: Not on file  ?Social Connections: Not on file  ?Intimate Partner Violence: Not on file  ?  ? ?Review of Systems  ?  ?General:  No chills, fever, night sweats or weight changes.  ?Cardiovascular: Positive for chest pain, with bandlike pain around the upper abdomen,  and pressure substernally, no dyspnea on exertion, edema, orthopnea, palpitations, paroxysmal nocturnal dyspnea. ?Dermatological: No rash, lesions/masses ?Respiratory: No cough, dyspnea ?Urologic: No hematuria, dysuria ?Abdominal:   No nausea, vomiting, diarrhea, bright red blood per rectum, melena, or hematemesis ?Neurologic:  No visual changes, wkns, changes in mental status. ?All other systems reviewed and are otherwise negative except as noted above. ? ?  ? ?Physical Exam  ?  ?VS:  BP 132/74   Pulse (!) 59   Ht 6\' 4"  (1.93 m)   Wt 206 lb (93.4 kg)   SpO2 99%   BMI 25.08 kg/m?  , BMI Body mass index is 25.08 kg/m?. ?    ?GEN: Well nourished, well developed, in no acute distress. ?HEENT: normal. ?Neck: Supple, no JVD, carotid bruits, or masses. ?Cardiac: RRR, no murmurs, rubs, or gallops. No clubbing, cyanosis, edema.  Radials/DP/PT 2+ and equal bilaterally.  ?Respiratory:  Respirations regular and unlabored, clear to auscultation bilaterally. ?GI: Soft, nontender, nondistended, BS + x 4. ?MS: no deformity or atrophy. ?Skin: warm and dry, no rash. ?Neuro:  Strength and sensation are intact. ?Psych: Normal affect. ? ?Accessory Clinical Findings  ?  ?ECG personally reviewed by me today-sinus bradycardia, heart rate 59 bpm, with possible left atrial enlargement, T wave flattening noted laterally- No acute changes ? ?Lab Results  ?Component Value Date  ? WBC 6.7 12/17/2019  ? HGB 13.3 12/17/2019  ? HCT 43.5 12/17/2019  ? MCV 95.2 12/17/2019  ? PLT 199 12/17/2019  ? ?Lab Results  ?Component Value Date  ? CREATININE 1.03 09/12/2020  ? BUN 14 09/12/2020  ? NA 139 09/12/2020  ? K 3.5 09/12/2020  ? CL 106 09/12/2020  ? CO2 24 09/12/2020  ? ?Lab Results  ?Component Value Date  ? ALT 21 12/17/2019  ? AST 27 12/17/2019  ? ALKPHOS 34 (L) 12/17/2019  ? BILITOT 0.5 12/17/2019  ? ?No results found for: CHOL, HDL, LDLCALC, LDLDIRECT, TRIG, CHOLHDL  ?No results found for: HGBA1C ? ?Review of Prior Studies: ?TTE 01/27/2020 ? 1.  Left ventricular ejection fraction, by estimation, is 60 to 65%. The  ?left ventricle has normal function. The left ventricle has no regional  ?wall motion abnormalities. Left ventricular diastolic parameters were  ?normal.  ? 2. Right ventricular systolic function is normal. The right ventricular  ?size is normal. There is normal pulmonary artery systolic pressure.  ? 3. Left atrial size was mildly dilated.  ? 4. Right atrial size was mildly dilated.  ? 5. The mitral valve is normal in structure. Trivial mitral valve  ?regurgitation. No evidence of mitral stenosis.  ? 6. The aortic valve is  tricuspid. Aortic valve regurgitation is not  ?visualized. Mild aortic valve sclerosis is present, with no evidence of  ?aortic valve stenosis.  ? 7. The inferior vena cava is normal in size with greater than 50%  ?respiratory variability, suggesting right atrial pressure of 3 mmHg.  ?  ?Zio 01/21/2020 ?1. 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 ?2. No atrial fibrillation.  ? ?Assessment & Plan  ? ?1.  Noncardiac chest pain: After evaluation of his symptoms, and presentation, with significant improvement in symptoms with PPI, this appears to be more GI in etiology.  He is able to run on the treadmill for approximately 20 minutes a minimum of twice a week without complaints of chest pain.  He has no chest discomfort with any type of exertion.  In fact he states that working out on the treadmill or becoming active actually improves his chest discomfort.  No plans to repeat any ischemic testing at this time to allow GI to finish their work-up. ? ?2.  Preprocedure cardiac evaluation: He may proceed with EGD with Dr. Allena Katz in [place].  He is able to complete 10 METS of exercise without eliciting chest pain while running on the treadmill and is a low cardiac risk to undergo procedure. ? ?3.  Palpitations: Although diltiazem 120 mg daily has been on his medication list he is not taking it.  I did offer to give him a new prescription but his  palpitations only occur on rare occasions or during cooldown after workout.  I will prescribe as needed diltiazem should his palpitations become more worrisome or are not resolving.   ? ?Current medicines are revi

## 2021-09-16 ENCOUNTER — Encounter: Payer: Self-pay | Admitting: Adult Health

## 2021-09-16 ENCOUNTER — Ambulatory Visit (INDEPENDENT_AMBULATORY_CARE_PROVIDER_SITE_OTHER): Payer: 59 | Admitting: Adult Health

## 2021-09-16 VITALS — BP 132/74 | HR 59 | Ht 76.0 in | Wt 206.0 lb

## 2021-09-16 DIAGNOSIS — R0789 Other chest pain: Secondary | ICD-10-CM | POA: Diagnosis not present

## 2021-09-16 DIAGNOSIS — Z01818 Encounter for other preprocedural examination: Secondary | ICD-10-CM

## 2021-09-16 DIAGNOSIS — R002 Palpitations: Secondary | ICD-10-CM

## 2021-09-16 MED ORDER — DILTIAZEM HCL 30 MG PO TABS: 30.0000 mg | ORAL_TABLET | ORAL | 1 refills | Status: DC | PRN

## 2021-09-16 NOTE — Patient Instructions (Signed)
Medication Instructions:  ?No Changes ?*If you need a refill on your cardiac medications before your next appointment, please call your pharmacy* ? ? ?Lab Work: ?None ?If you have labs (blood work) drawn today and your tests are completely normal, you will receive your results only by: ?MyChart Message (if you have MyChart) OR ?A paper copy in the mail ?If you have any lab test that is abnormal or we need to change your treatment, we will call you to review the results. ? ? ?Testing/Procedures: ?None ? ? ?Follow-Up: ?At [facility], you and your health needs are our priority.  As part of our continuing mission to provide you with exceptional heart care, we have created designated Provider Care Teams.  These Care Teams include your primary Cardiologist (physician) and Advanced Practice Providers (APPs -  Physician Assistants and Nurse Practitioners) who all work together to provide you with the care you need, when you need it. ? ?We recommend signing up for the patient portal called "MyChart".  Sign up information is provided on this After Visit Summary.  MyChart is used to connect with patients for Virtual Visits (Telemedicine).  Patients are able to view lab/test results, encounter notes, upcoming appointments, etc.  Non-urgent messages can be sent to your provider as well.   ?To learn more about what you can do with MyChart, go to [email or web address].   ? ?Your next appointment:   ?6 months ? ?The format for your next appointment:   ?In Person ? ?Provider:   ?Lennie Odor, MD  ? ? ?  ?

## 2021-10-07 IMAGING — CR DG CHEST 2V
2 series · 2 of 2 positions shown · non-contrast
Comparison: 10/27/2017

CLINICAL DATA: Chest pain

EXAM:
CHEST - 2 VIEW

[w chest pa]
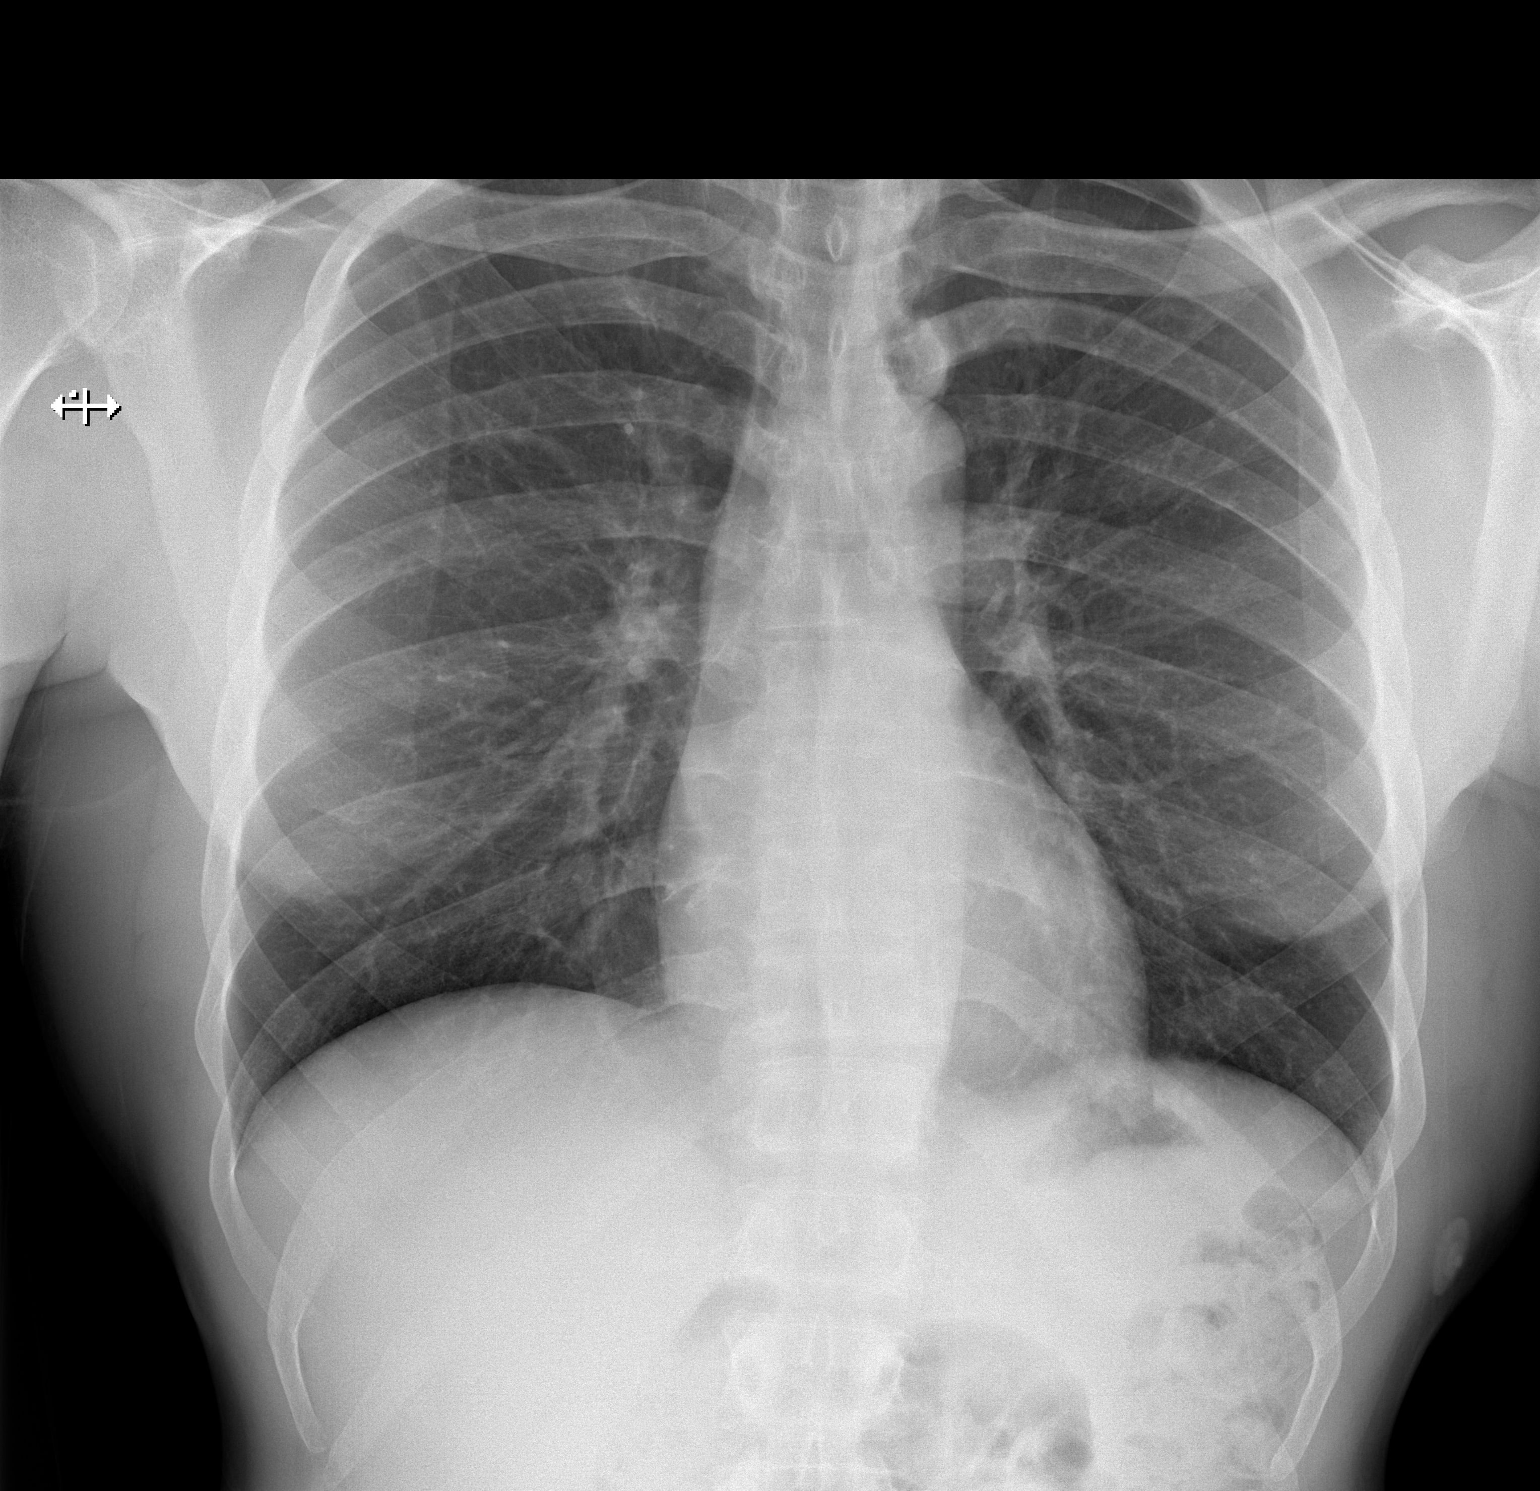

[w chest lat]
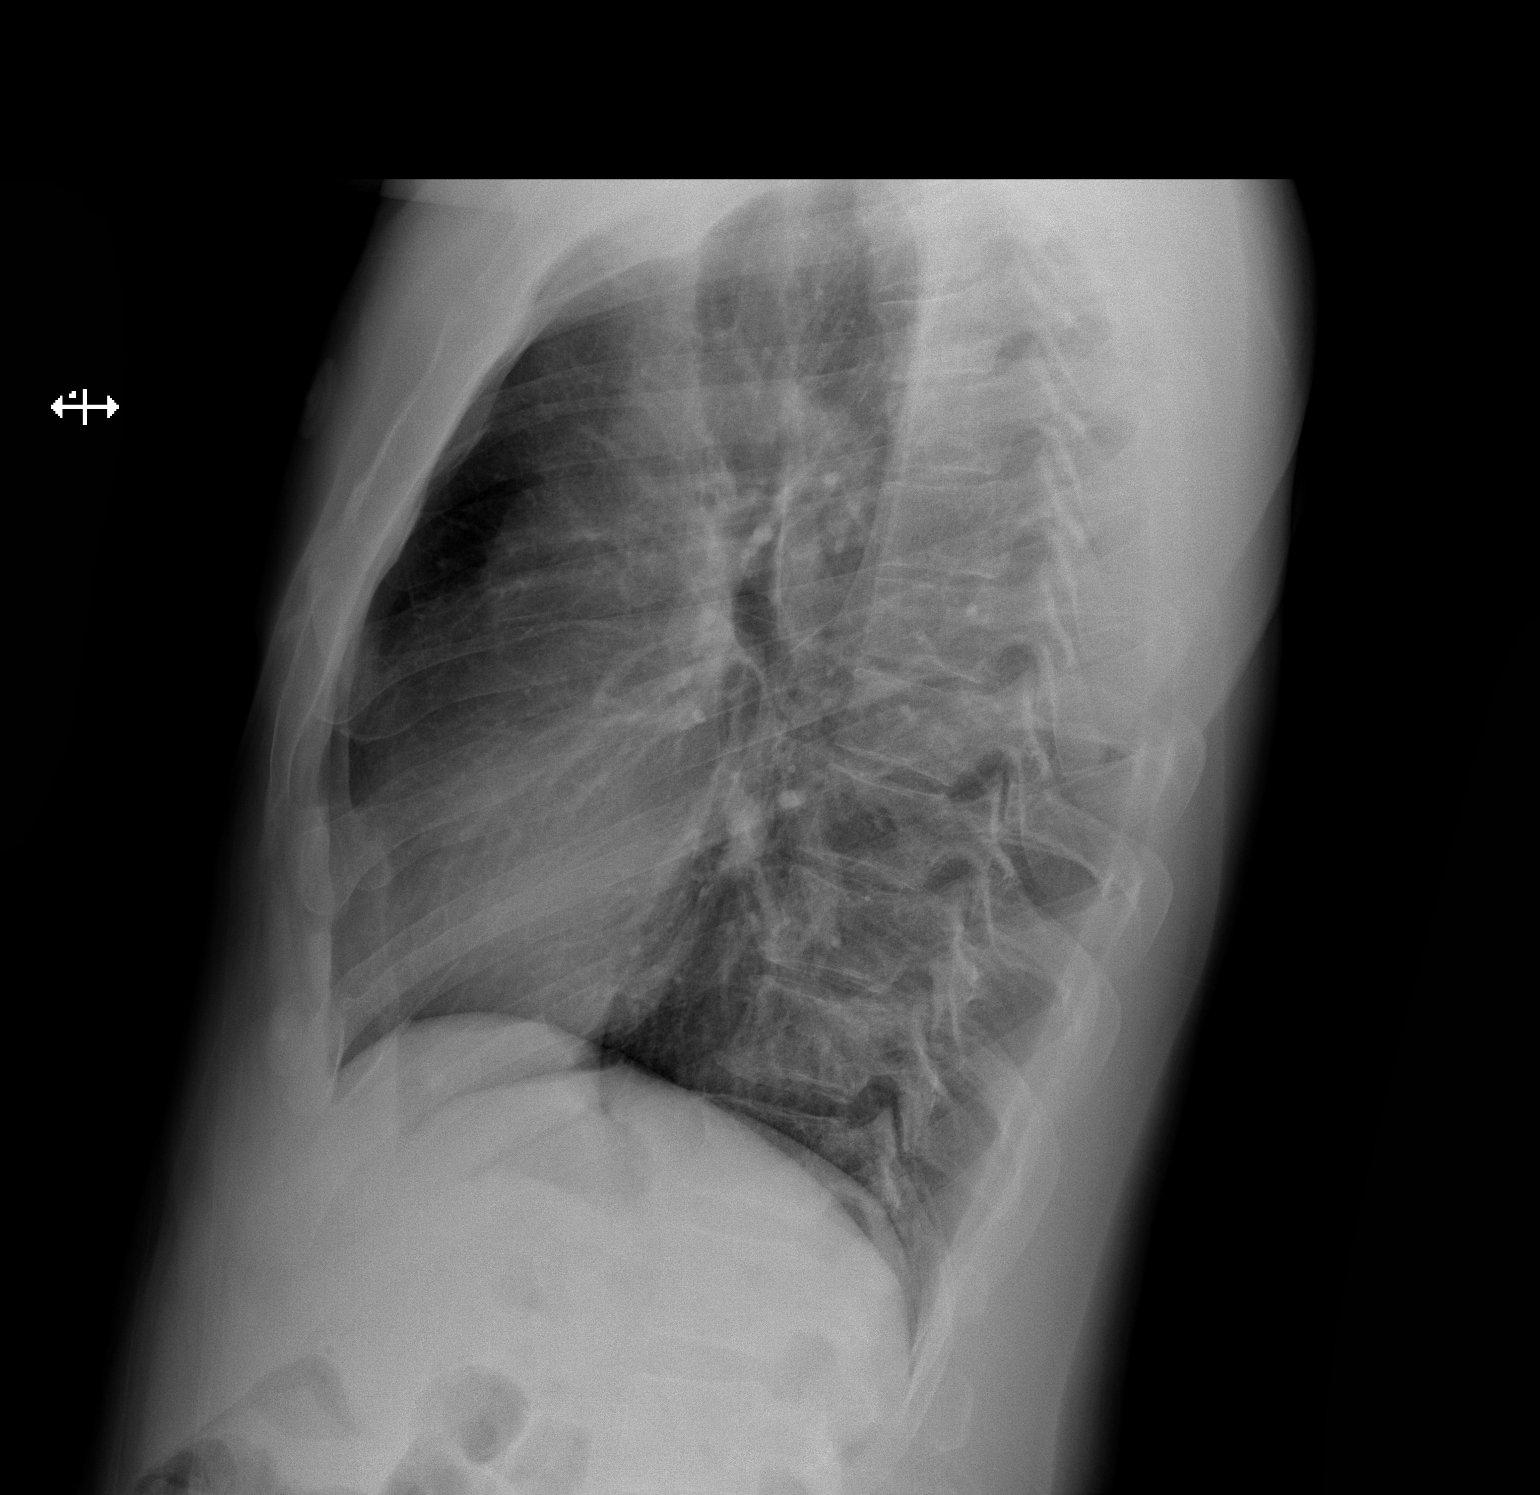

[2 of 2 positions shown; findings below may reference images not displayed]

FINDINGS: The heart size and mediastinal contours are within normal limits.
Both lungs are clear. The visualized skeletal structures are
unremarkable.
IMPRESSION: No active cardiopulmonary disease.

## 2021-10-08 ENCOUNTER — Other Ambulatory Visit: Payer: Self-pay | Admitting: Adult Health

## 2021-10-14 IMAGING — CR DG CHEST 2V
2 series · 2 of 2 positions shown · non-contrast
Comparison: Seven days ago

CLINICAL DATA: Heart palpitation and nausea

EXAM:
CHEST - 2 VIEW

[w chest pa]
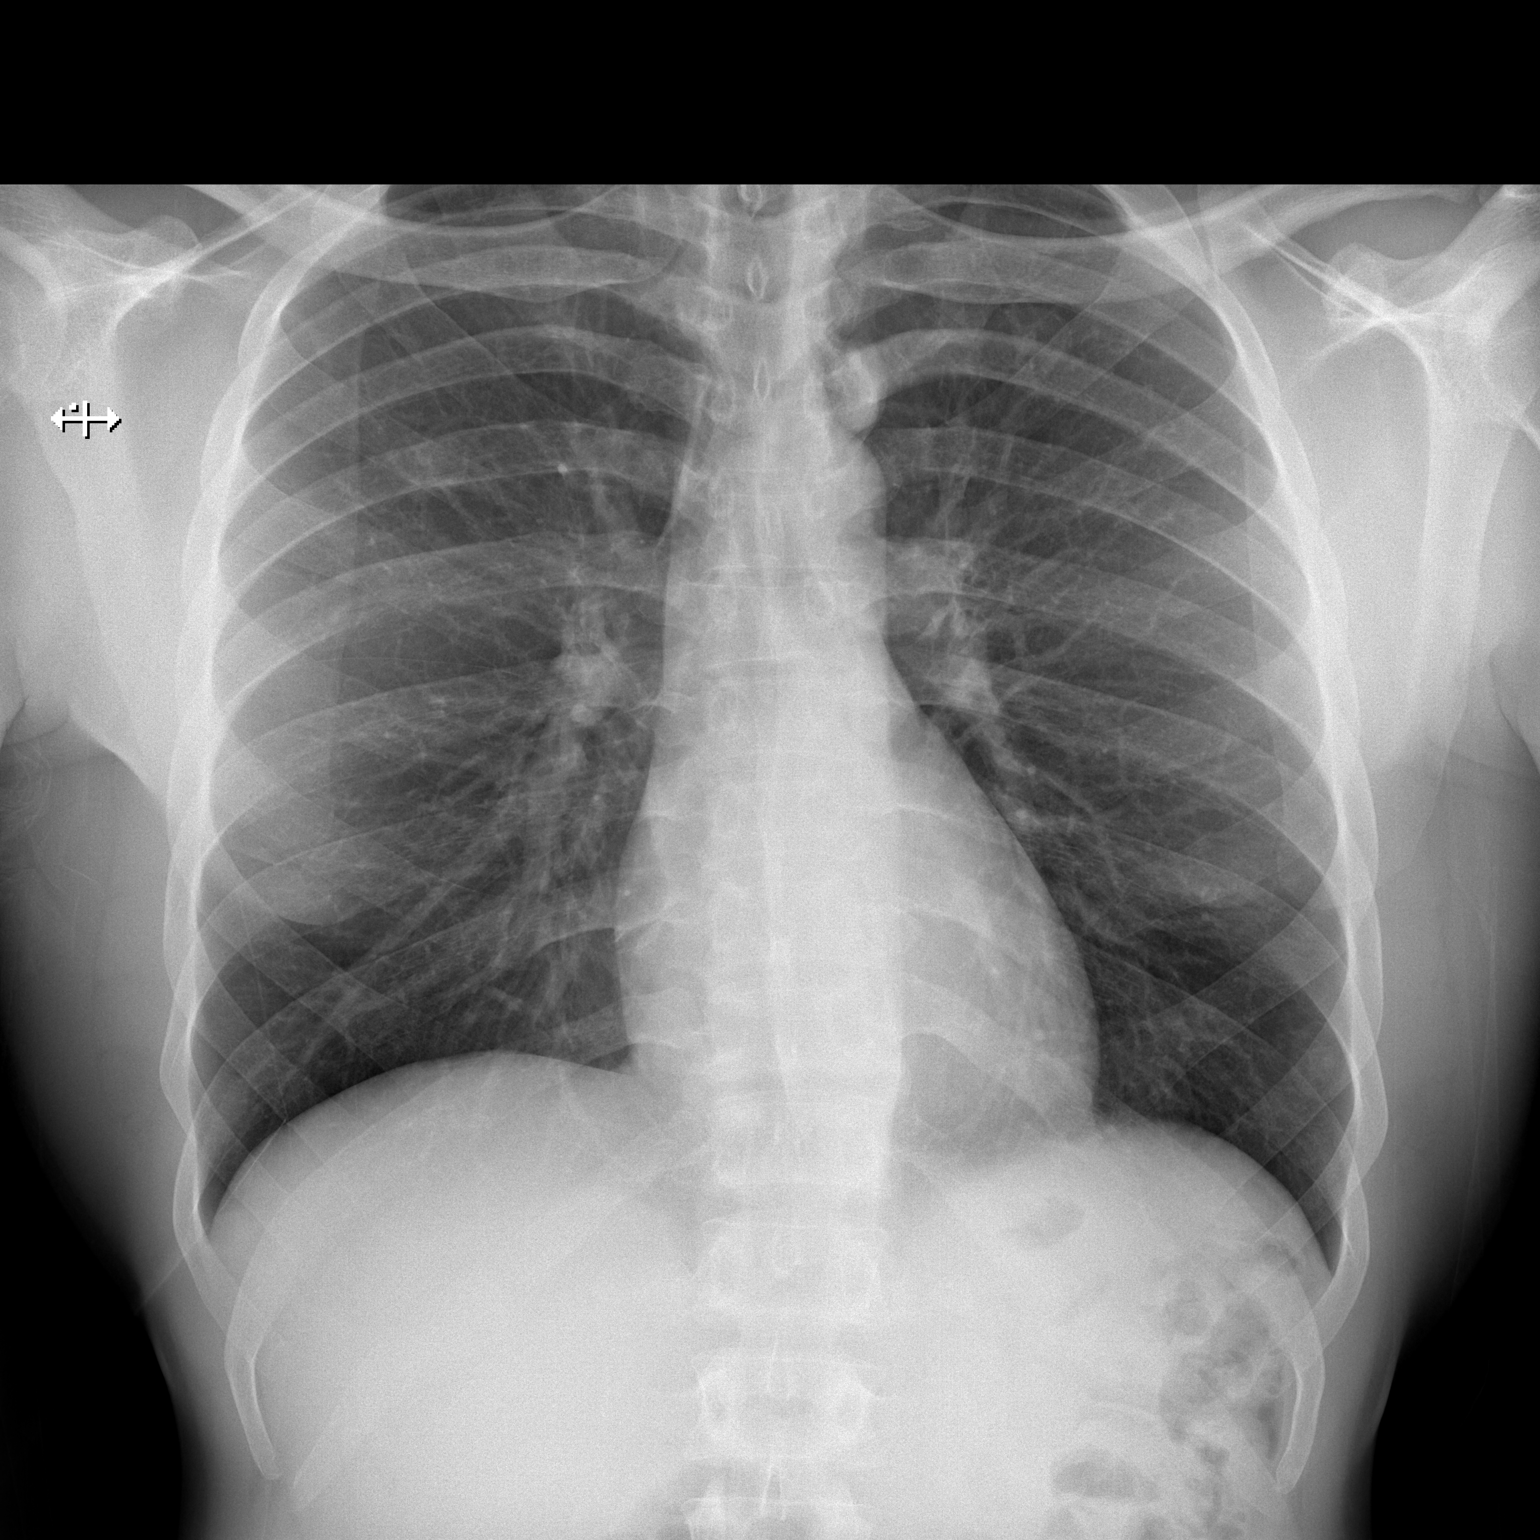

[w chest lat]
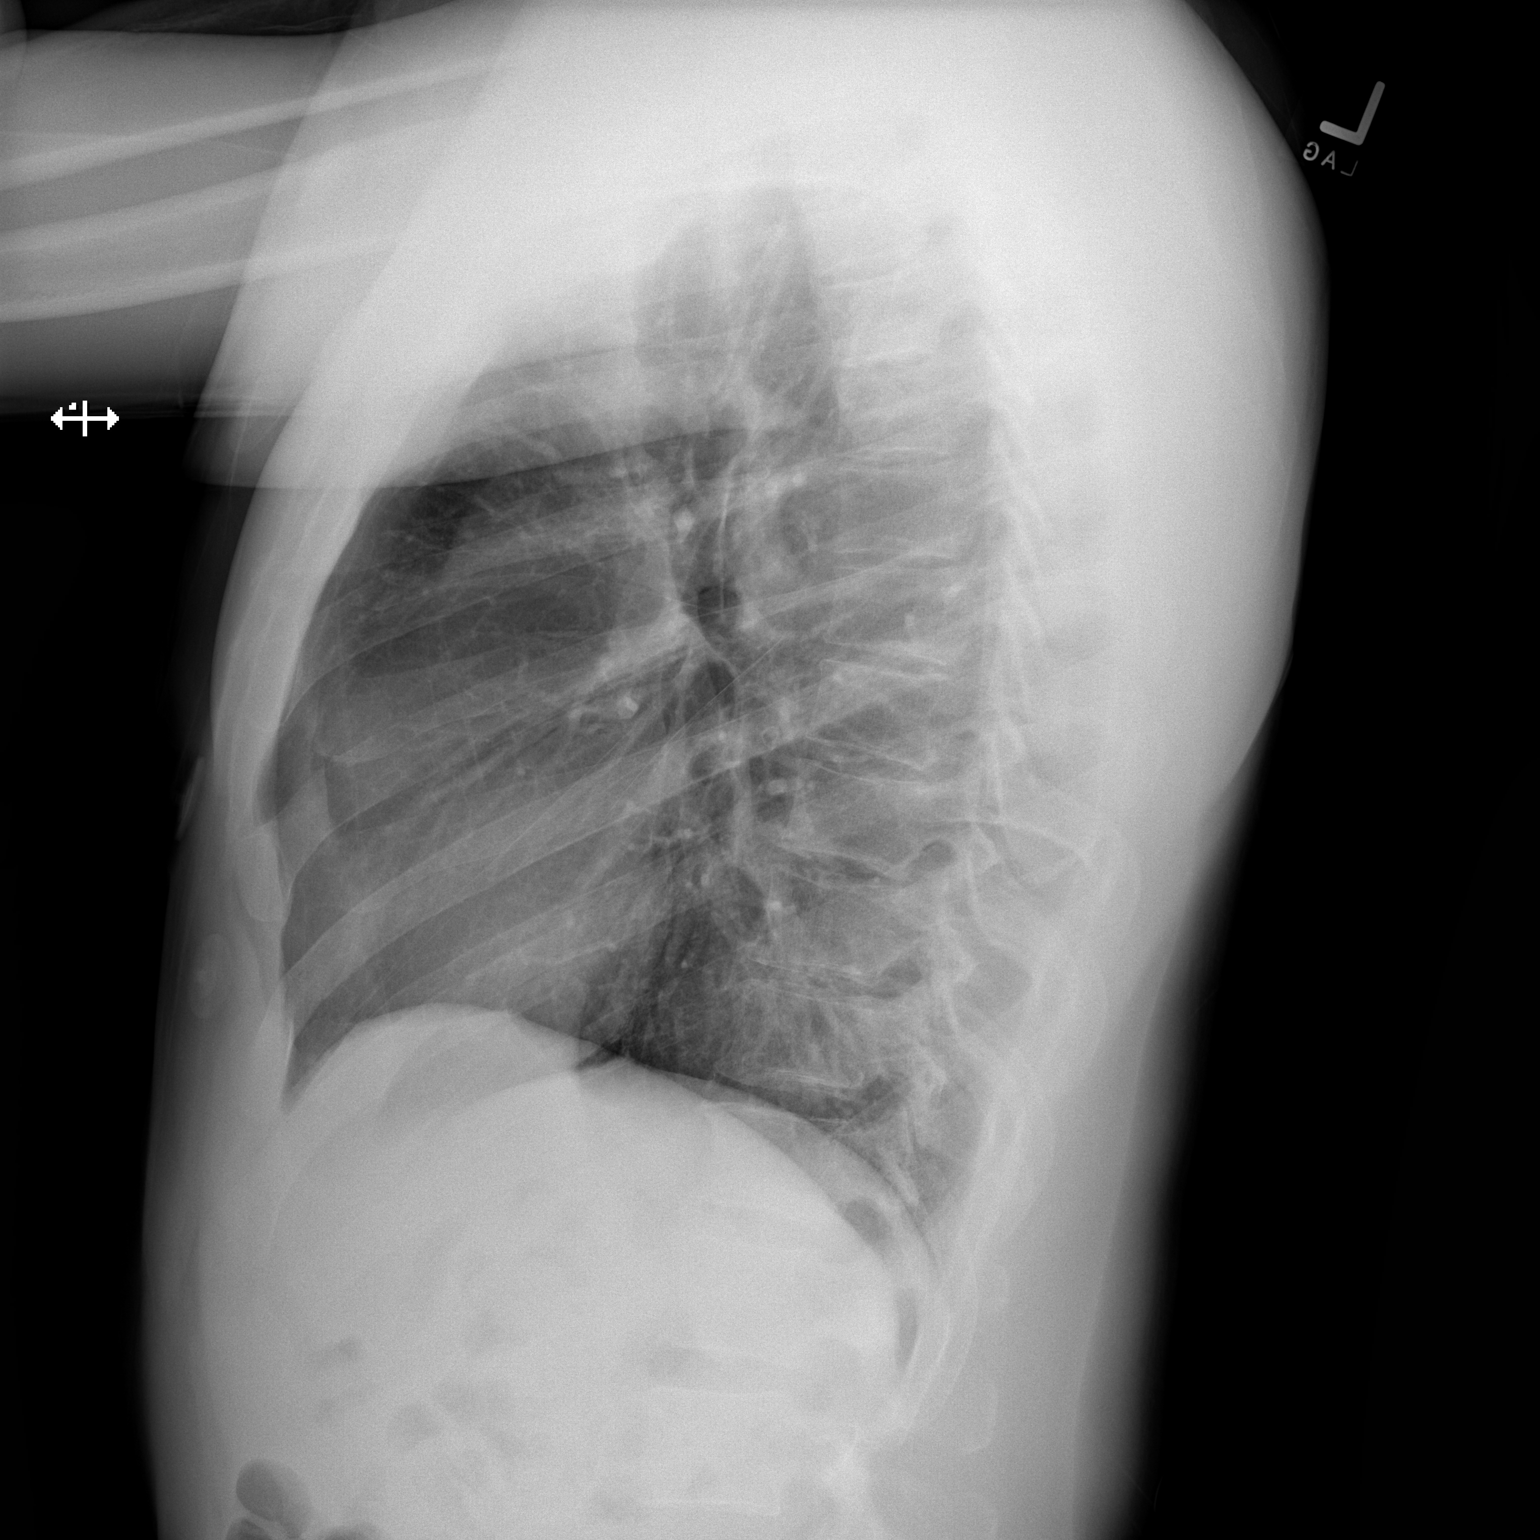

[2 of 2 positions shown; findings below may reference images not displayed]

FINDINGS: The heart size and mediastinal contours are within normal limits.
Both lungs are clear. The visualized skeletal structures are
unremarkable.
IMPRESSION: No active cardiopulmonary disease.

## 2021-10-24 ENCOUNTER — Telehealth: Payer: Self-pay | Admitting: Adult Health

## 2021-10-24 NOTE — Telephone Encounter (Signed)
?  Pt c/o medication issue: ? ?1. Name of Medication: diltiazem (CARDIZEM) 30 MG tablet ? ?2. How are you currently taking this medication (dosage and times per day)? TAKE 1 TABLET (30 MG TOTAL) BY MOUTH AS NEEDED. ? ?3. Are you having a reaction (difficulty breathing--STAT)?  ? ?4. What is your medication issue? Pt said, he's been coughing almost everyday since he started taking this meds ?

## 2021-10-24 NOTE — Telephone Encounter (Signed)
Called patient, he states he started taking his diltiazem and it was working well for his palpitations- after about 2 weeks he started having a cough. He states he has had the cough every since starting the medication and it has continued. He would like to discuss switching to something else if possible as he read the side effects of the medication and he states it can cause a cough. ?I advised I would route to MD to review, and call him back with recommendations.  ? ?

## 2021-10-26 NOTE — Telephone Encounter (Signed)
I attempted to contact patient to discuss, unable to reach or leave a message.  ?Will try again.  ? ?

## 2021-11-01 NOTE — Telephone Encounter (Signed)
Attempted to contacted patient to discuss med changes.  ?Patient did not answer, unable to leave a voicemail.  ? ?Thank you!  ? ?

## 2021-11-01 NOTE — Telephone Encounter (Signed)
Patient returned call

## 2021-11-01 NOTE — Telephone Encounter (Signed)
Returned call to patient, made aware of recommendations: ? ?Lets stop diltiazem and start metoprolol succinate 25 mg daily.  ? ?Gerri Spore T. Flora Lipps, MD, [facility]  ?[place]  [facility]  ?[address], [address]  ?[place], [place] [address]  ?[phone]  ?9:42 PM  ? ? ?Patient reports being on this prior to diltiazem and he had side effects and is unable to take this (see phone note 03/01/2020) ? ?Routed to MD to review.  ?

## 2021-11-02 MED ORDER — VERAPAMIL HCL ER 120 MG PO TBCR: 120.0000 mg | EXTENDED_RELEASE_TABLET | Freq: Every day | ORAL | 11 refills | Status: DC

## 2021-11-02 NOTE — Telephone Encounter (Signed)
Attempted to contact patient to discuss medication changes from MD.  ?Unable to leave a message, will try again later.  ? ?

## 2021-11-02 NOTE — Telephone Encounter (Signed)
Follow Up:      Patient is returning call from today. 

## 2021-11-02 NOTE — Telephone Encounter (Signed)
-  Pt updated and verbalized understanding. ?-Med list updated to reflect change ? ?Geralynn Rile, MD ?to Silverio Lay, RN  Caprice Beaver, LPN   ?   QA348G PM ?Verapamil 120 mg XR daily.  ?

## 2022-02-20 NOTE — Progress Notes (Deleted)
Cardiology Office Note:   Date:  02/20/2022  NAME:  Bryan Reilly    MRN: [id number] DOB:  [birth year]   PCP:  Renaye Rakers, MD  Cardiologist:  None  Electrophysiologist:  None   Referring MD: Renaye Rakers, MD   No chief complaint on file. ***  History of Present Illness:   Bryan Reilly is a 51 y.o. male with a hx of *** who is being seen today for the evaluation of *** at the request of ***.  Problem List PACs -5.4% burden   Past Medical History: Past Medical History:  Diagnosis Date   Bilateral headaches     Past Surgical History: Past Surgical History:  Procedure Laterality Date   EYE SURGERY      Current Medications: No outpatient medications have been marked as taking for the 02/22/22 encounter (Appointment) with Sande Rives, MD.     Allergies:    Bee venom and Metoprolol   Social History: Social History   Socioeconomic History   Marital status: Married    Spouse name: Yolanda   Number of children: 2   Years of education: Not on file   Highest education level: 12th grade  Occupational History    Employer: [employer]  Tobacco Use   Smoking status: Never   Smokeless tobacco: Never  Vaping Use   Vaping Use: Never used  Substance and Sexual Activity   Alcohol use: Yes   Drug use: Yes    Types: Marijuana   Sexual activity: Not on file  Other Topics Concern   Not on file  Social History Narrative   Pt lives in 2 story home with his wife, 2 step children and his wife's grandmother   Has 2 children of his own   12th grade education   Worked for [employer]      Patient is is right-handed. He drinks 1 cup of coffee QOD. He recently stopped drinking sodas. He does not exercise.   Social Determinants of Health   Financial Resource Strain: Not on file  Food Insecurity: Not on file  Transportation Needs: Not on file  Physical Activity: Not on file  Stress: Not on file  Social Connections: Not on file     Family History: The patient's ***family  history includes CVA in his mother; Coronary artery disease in his mother; Heart disease in his father; Hypertension in his mother; Lung cancer in his father.  ROS:   All other ROS reviewed and negative. Pertinent positives noted in the HPI.     EKGs/Labs/Other Studies Reviewed:   The following studies were personally reviewed by me today:  EKG:  EKG is *** ordered today.  The ekg ordered today demonstrates ***, and was personally reviewed by me.   Zio 01/21/2020 1. 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 2. No atrial fibrillation.   TTE 01/27/2020  1. Left ventricular ejection fraction, by estimation, is 60 to 65%. The  left ventricle has normal function. The left ventricle has no regional  wall motion abnormalities. Left ventricular diastolic parameters were  normal.   2. Right ventricular systolic function is normal. The right ventricular  size is normal. There is normal pulmonary artery systolic pressure.   3. Left atrial size was mildly dilated.   4. Right atrial size was mildly dilated.   5. The mitral valve is normal in structure. Trivial mitral valve  regurgitation. No evidence of mitral stenosis.   6. The aortic valve is tricuspid. Aortic valve regurgitation is not  visualized. Mild aortic valve  sclerosis is present, with no evidence of  aortic valve stenosis.   7. The inferior vena cava is normal in size with greater than 50%  respiratory variability, suggesting right atrial pressure of 3 mmHg.  Recent Labs: No results found for requested labs within last 365 days.   Recent Lipid Panel No results found for: "CHOL", "TRIG", "HDL", "CHOLHDL", "VLDL", "LDLCALC", "LDLDIRECT"  Physical Exam:   VS:  There were no vitals taken for this visit.   Wt Readings from Last 3 Encounters:  09/16/21 206 lb (93.4 kg)  09/12/20 204 lb 5.9 oz (92.7 kg)  04/15/20 204 lb 6.4 oz (92.7 kg)    General: Well nourished, well developed, in no acute distress Head: Atraumatic, normal size  Eyes: PEERLA,  EOMI  Neck: Supple, no JVD Endocrine: No thryomegaly Cardiac: Normal S1, S2; RRR; no murmurs, rubs, or gallops Lungs: Clear to auscultation bilaterally, no wheezing, rhonchi or rales  Abd: Soft, nontender, no hepatomegaly  Ext: No edema, pulses 2+ Musculoskeletal: No deformities, BUE and BLE strength normal and equal Skin: Warm and dry, no rashes   Neuro: Alert and oriented to person, place, time, and situation, CNII-XII grossly intact, no focal deficits  Psych: Normal mood and affect   ASSESSMENT:   Bryan Reilly is a 51 y.o. male who presents for the following: No diagnosis found.  PLAN:   There are no diagnoses linked to this encounter.  {Are you ordering a CV Procedure (e.g. stress test, cath, DCCV, TEE, etc)?   Press F2        :[id number]}  Disposition: No follow-ups on file.  Medication Adjustments/Labs and Tests Ordered: Current medicines are reviewed at length with the patient today.  Concerns regarding medicines are outlined above.  No orders of the defined types were placed in this encounter.  No orders of the defined types were placed in this encounter.   There are no Patient Instructions on file for this visit.   Time Spent with Patient: I have spent a total of *** minutes with patient reviewing hospital notes, telemetry, EKGs, labs and examining the patient as well as establishing an assessment and plan that was discussed with the patient.  > 50% of time was spent in direct patient care.  Signed, Lenna Gilford. Flora Lipps, MD, [facility]  [facility]  [address] [place], [place] [address] [phone]  02/20/2022 4:52 PM

## 2022-02-22 ENCOUNTER — Ambulatory Visit: Payer: 59 | Attending: Cardiovascular Disease | Admitting: Cardiovascular Disease

## 2022-02-22 DIAGNOSIS — I491 Atrial premature depolarization: Secondary | ICD-10-CM

## 2022-02-22 DIAGNOSIS — R002 Palpitations: Secondary | ICD-10-CM

## 2024-07-04 ENCOUNTER — Ambulatory Visit: Attending: Nurse Practitioner | Admitting: Nurse Practitioner

## 2024-07-04 VITALS — BP 128/88 | HR 68 | Ht 76.0 in | Wt 191.0 lb

## 2024-07-04 DIAGNOSIS — R011 Cardiac murmur, unspecified: Secondary | ICD-10-CM | POA: Insufficient documentation

## 2024-07-04 DIAGNOSIS — R7303 Prediabetes: Secondary | ICD-10-CM | POA: Diagnosis present

## 2024-07-04 DIAGNOSIS — R9431 Abnormal electrocardiogram [ECG] [EKG]: Secondary | ICD-10-CM | POA: Insufficient documentation

## 2024-07-04 DIAGNOSIS — R002 Palpitations: Secondary | ICD-10-CM | POA: Insufficient documentation

## 2024-07-04 DIAGNOSIS — I491 Atrial premature depolarization: Secondary | ICD-10-CM | POA: Diagnosis present

## 2024-07-04 DIAGNOSIS — R072 Precordial pain: Secondary | ICD-10-CM | POA: Diagnosis present

## 2024-07-04 MED ORDER — IVABRADINE HCL 5 MG PO TABS: 10.0000 mg | ORAL_TABLET | Freq: Once | ORAL | 0 refills | Status: AC

## 2024-07-04 NOTE — Patient Instructions (Addendum)
 Medication Instructions:  Your physician recommends that you continue on your current medications as directed. Please refer to the Current Medication list given to you today.  *If you need a refill on your cardiac medications before your next appointment, please call your pharmacy*  Lab Work: TODAY:  CMET  If you have labs (blood work) drawn today and your tests are completely normal, you will receive your results only by: MyChart Message (if you have MyChart) OR A paper copy in the mail If you have any lab test that is abnormal or we need to change your treatment, we will call you to review the results.  Testing/Procedures: Your physician has requested that you have an echocardiogram. Echocardiography is a painless test that uses sound waves to create images of your heart. It provides your doctor with information about the size and shape of your heart and how well your hearts chambers and valves are working. This procedure takes approximately one hour. There are no restrictions for this procedure. Please do NOT wear cologne, perfume, aftershave, or lotions (deodorant is allowed). Please arrive 15 minutes prior to your appointment time.  Please note: We ask at that you not bring children with you during ultrasound (echo/ vascular) testing. Due to room size and safety concerns, children are not allowed in the ultrasound rooms during exams. Our front office staff cannot provide observation of children in our lobby area while testing is being conducted. An adult accompanying a patient to their appointment will only be allowed in the ultrasound room at the discretion of the ultrasound technician under special circumstances. We apologize for any inconvenience.    Your physician has requested that you have cardiac CT. Cardiac computed tomography (CT) is a painless test that uses an x-ray machine to take clear, detailed pictures of your heart. For further information please visit [email or web address]  Please follow instruction sheet BELOW:    Your cardiac CT has been scheduled at     [facility] and Vascular Tower [address]  [place], [place] [address]  07/16/24 ARRIVE AT 12 NOON  If scheduled at the Heart and Vascular Tower at [facility] street, please enter the parking lot using the [address] entrance and use the FREE valet service at the patient drop-off area. Enter the building and check-in with registration on the main floor.   Please follow these instructions carefully (unless otherwise directed):  An IV will be required for this test and Nitroglycerin will be given.  Hold all erectile dysfunction medications at least 3 days (72 hrs) prior to test. (Ie viagra, cialis, sildenafil, tadalafil, etc)   On the Night Before the Test: Be sure to Drink plenty of water. Do not consume any caffeinated/decaffeinated beverages or chocolate 12 hours prior to your test. Do not take any antihistamines 12 hours prior to your test.   On the Day of the Test: Drink plenty of water until 1 hour prior to the test. Do not eat any food 1 hour prior to test. You may take your regular medications prior to the test.  Take  Ivabradine  5 mg taking 2 tablets 2 hours before the Cardiac CT        After the Test: Drink plenty of water. After receiving IV contrast, you may experience a mild flushed feeling. This is normal. On occasion, you may experience a mild rash up to 24 hours after the test. This is not dangerous. If this occurs, you can take Benadryl 25 mg, Zyrtec, Claritin, or Allegra and  increase your fluid intake. (Patients taking Tikosyn should avoid Benadryl, and may take Zyrtec, Claritin, or Allegra) If you experience trouble breathing, this can be serious. If it is severe call 911 IMMEDIATELY. If it is mild, please call our office.  We will call to schedule your test 2-4 weeks out understanding that some insurance companies will need an authorization prior to the service  being performed.   For more information and frequently asked questions, please visit our website : [email or web address]  For non-scheduling related questions, please contact the cardiac imaging nurse navigator should you have any questions/concerns: Cardiac Imaging Nurse Navigators Direct Office Dial: [phone]   For scheduling needs, including cancellations and rescheduling, please call Brittany, [phone].   Follow-Up: At [facility], you and your health needs are our priority.  As part of our continuing mission to provide you with exceptional heart care, our providers are all part of one team.  This team includes your primary Cardiologist (physician) and Advanced Practice Providers or APPs (Physician Assistants and Nurse Practitioners) who all work together to provide you with the care you need, when you need it.  Your next appointment:   6 week(s)  Provider:   Damien Braver, NP          We recommend signing up for the patient portal called MyChart.  Sign up information is provided on this After Visit Summary.  MyChart is used to connect with patients for Virtual Visits (Telemedicine).  Patients are able to view lab/test results, encounter notes, upcoming appointments, etc.  Non-urgent messages can be sent to your provider as well.   To learn more about what you can do with MyChart, go to [email or web address].   Other Instructions

## 2024-07-04 NOTE — Progress Notes (Signed)
 "  Office Visit    Patient Name: Bryan Reilly Date of Encounter: 07/04/2024  Primary Care Provider:  Benjamine Aland, MD Primary Cardiologist:  Darryle ONEIDA Decent, MD  Chief Complaint    54 year old male with a history of palpitations, symptomatic PACs, cardiac murmur, abnormal EKG, prediabetes, and polysubstance abuse who presents for heart first clinic new patient evaluation.  Past Medical History    Past Medical History:  Diagnosis Date   Bilateral headaches    Past Surgical History:  Procedure Laterality Date   EYE SURGERY      Allergies  Allergies[1]   Labs/Other Studies Reviewed    The following studies were reviewed today:  Cardiac Studies & Procedures   ______________________________________________________________________________________________     ECHOCARDIOGRAM  ECHOCARDIOGRAM COMPLETE 01/27/2020  Narrative ECHOCARDIOGRAM REPORT    Patient Name:   Bryan Reilly Date of Exam: 01/27/2020 Medical Rec #:  [id number]     Height:       76.0 in Accession #:    [id number]    Weight:       194.0 lb Date of Birth:  12/19/1970     BSA:          2.187 m Patient Age:    49 years      BP:           122/76 mmHg Patient Gender: M             HR:           63 bpm. Exam Location:  [address]  Procedure: 2D Echo, 3D Echo, Cardiac Doppler and Color Doppler  Indications:    R07.9 Chest Pain  History:        Patient has no prior history of Echocardiogram examinations. Arrythmias:RBBB, Signs/Symptoms:Shortness of Breath; Risk Factors:Family History of Coronary Artery Disease. Palpitations, History of ETOH Abuse, Headaches.  Sonographer:    Heather Hawks RDCS Referring Phys: [id number] DARRYLE NED O'NEAL  IMPRESSIONS   1. Left ventricular ejection fraction, by estimation, is 60 to 65%. The left ventricle has normal function. The left ventricle has no regional wall motion abnormalities. Left ventricular diastolic parameters were normal. 2. Right ventricular  systolic function is normal. The right ventricular size is normal. There is normal pulmonary artery systolic pressure. 3. Left atrial size was mildly dilated. 4. Right atrial size was mildly dilated. 5. The mitral valve is normal in structure. Trivial mitral valve regurgitation. No evidence of mitral stenosis. 6. The aortic valve is tricuspid. Aortic valve regurgitation is not visualized. Mild aortic valve sclerosis is present, with no evidence of aortic valve stenosis. 7. The inferior vena cava is normal in size with greater than 50% respiratory variability, suggesting right atrial pressure of 3 mmHg.  FINDINGS Left Ventricle: Left ventricular ejection fraction, by estimation, is 60 to 65%. The left ventricle has normal function. The left ventricle has no regional wall motion abnormalities. The left ventricular internal cavity size was normal in size. There is no left ventricular hypertrophy. Left ventricular diastolic parameters were normal. Normal left ventricular filling pressure.  Right Ventricle: The right ventricular size is normal. No increase in right ventricular wall thickness. Right ventricular systolic function is normal. There is normal pulmonary artery systolic pressure. The tricuspid regurgitant velocity is 2.31 m/s, and with an assumed right atrial pressure of 3 mmHg, the estimated right ventricular systolic pressure is 24.3 mmHg.  Left Atrium: Left atrial size was mildly dilated.  Right Atrium: Right atrial size was mildly dilated.  Pericardium: There is no  evidence of pericardial effusion.  Mitral Valve: The mitral valve is normal in structure. Normal mobility of the mitral valve leaflets. Trivial mitral valve regurgitation. No evidence of mitral valve stenosis.  Tricuspid Valve: The tricuspid valve is normal in structure. Tricuspid valve regurgitation is trivial. No evidence of tricuspid stenosis.  Aortic Valve: The aortic valve is tricuspid. Aortic valve regurgitation is  not visualized. Mild aortic valve sclerosis is present, with no evidence of aortic valve stenosis.  Pulmonic Valve: The pulmonic valve was normal in structure. Pulmonic valve regurgitation is trivial. No evidence of pulmonic stenosis.  Aorta: The aortic root is normal in size and structure.  Venous: The inferior vena cava is normal in size with greater than 50% respiratory variability, suggesting right atrial pressure of 3 mmHg.  IAS/Shunts: No atrial level shunt detected by color flow Doppler.   LEFT VENTRICLE PLAX 2D LVIDd:         5.40 cm  Diastology LVIDs:         3.60 cm  LV e' lateral:   16.00 cm/s LV PW:         1.10 cm  LV E/e' lateral: 5.3 LV IVS:        0.80 cm  LV e' medial:    9.57 cm/s LVOT diam:     2.30 cm  LV E/e' medial:  8.8 LV SV:         96 LV SV Index:   44 LVOT Area:     4.15 cm  3D Volume EF: 3D EF:        72 % LV EDV:       193 ml LV ESV:       54 ml LV SV:        139 ml  LEFT ATRIUM             Index       RIGHT ATRIUM           Index LA diam:        4.00 cm 1.83 cm/m  RA Area:     25.10 cm LA Vol (A2C):   58.2 ml 26.62 ml/m RA Volume:   81.90 ml  37.45 ml/m LA Vol (A4C):   94.0 ml 42.99 ml/m LA Biplane Vol: 75.2 ml 34.39 ml/m AORTIC VALVE LVOT Vmax:   92.40 cm/s LVOT Vmean:  60.900 cm/s LVOT VTI:    0.231 m  AORTA Ao Root diam: 3.30 cm Ao Asc diam:  2.90 cm  MITRAL VALVE               TRICUSPID VALVE MV Area (PHT): cm         TR Peak grad:   21.3 mmHg MV Decel Time: 220 msec    TR Vmax:        231.00 cm/s MV E velocity: 84.20 cm/s MV A velocity: 62.10 cm/s  SHUNTS MV E/A ratio:  1.36        Systemic VTI:  0.23 m Systemic Diam: 2.30 cm  Wilbert Bihari MD Electronically signed by Wilbert Bihari MD Signature Date/Time: 01/27/2020/4:10:43 PM    Final    MONITORS  LONG TERM MONITOR (3-14 DAYS) 02/03/2020  Narrative Enrollment 01/21/2020-01/23/2020 (2 days 22 hours). Patient had a min HR of 41 bpm (sinus bradycardia), max HR of 124 bpm  (sinus tachycardia), and avg HR of 66 bpm (normal sinus rhythm). Predominant underlying rhythm was Sinus Rhythm. Isolated SVEs were frequent (5.4%, 14519), SVE Couplets were rare (<1.0%, 2),  and no SVE Triplets were present. Isolated VEs were rare (<1.0%), and no VE Couplets or VE Triplets were present.  There were several patient triggered events that coincided with PACs. The patient diary did not include a time to be evaluated.  Impression:  1. 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 2. No atrial fibrillation.  Darryle T. Barbaraann, MD [facility] [address] [place], [place] [address] [phone] 6:21 PM       ______________________________________________________________________________________________     Recent Labs: 07/04/2024: ALT 34; BUN 16; Creatinine, Ser 1.09; Potassium 4.3; Sodium 141  Recent Lipid Panel No results found for: CHOL, TRIG, HDL, CHOLHDL, VLDL, LDLCALC, LDLDIRECT  History of Present Illness    54 year old male with the above past medical history including palpitations, symptomatic PACs, cardiac murmur, abnormal EKG, prediabetes, and polysubstance abuse who presents for heart first clinic new patient evaluation.  He was previously evaluated by Dr. Barbaraann in 2021 in the setting of palpitations.  Echocardiogram in 2021 showed EF 60 to 65%, normal LV function, no RWMA, normal RV systolic function, no significant valvular abnormalities.  Cardiac monitor in 01/2020 revealed predominantly normal sinus rhythm, 5.4% PAC burden, rare PACs.  He was started on diltiazem .  He was last seen in the office on 09/16/2021 by Lamarr Satterfield, NP.  He reported increased palpitations with associated shortness of breath, nonexertional chest pain.  Diltiazem  was changed to as needed use.  He has not been seen since.  He went for a preemployment physical on 06/23/2024 and was told he had a heart murmur, abnormal EKG.  He was referred to cardiology.  He  presents today for heart first clinic new patient evaluation.  He reports occasional palpitations in the setting of stress, denies any associated symptoms.  Approximately 1 month ago, he had an episode of chest discomfort while at work.  He was exerting himself and under significant amount of stress at the time.  He denies any recurrent chest pain.  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  His job causes him a significant amount of stress.  He denies any significant family history of heart disease.  He lives at home with his wife, who is a engineer, civil (consulting).  He has 2 grown children, 2 granddaughters.  He does not exercise regularly.  He drinks 1 glass of wine a day.  1 cup of coffee per day.  Home Medications    No current outpatient medications on file.   No current facility-administered medications for this visit.     Review of Systems    He denies dyspnea, pnd, orthopnea, n, v, dizziness, syncope, edema, weight gain, or early satiety. All other systems reviewed and are otherwise negative except as noted above.   Physical Exam    VS:  BP 128/88   Pulse 68   Ht 6' 4 (1.93 m)   Wt 191 lb (86.6 kg)   SpO2 98%   BMI 23.25 kg/m   GEN: Well nourished, well developed, in no acute distress. HEENT: normal. Neck: Supple, no JVD, carotid bruits, or masses. Cardiac: RRR, no murmurs, rubs, or gallops. No clubbing, cyanosis, edema.  Radials/DP/PT 2+ and equal bilaterally.  Respiratory:  Respirations regular and unlabored, clear to auscultation bilaterally. GI: Soft, nontender, nondistended, BS + x 4. MS: no deformity or atrophy. Skin: warm and dry, no rash. Neuro:  Strength and sensation are intact. Psych: Normal affect.  Accessory Clinical Findings    ECG personally reviewed by me today -    -  no EKG in office today.    Lab Results  Component Value Date   WBC 6.7 12/17/2019   HGB 13.3 12/17/2019   HCT 43.5 12/17/2019   MCV 95.2 12/17/2019   PLT 199 12/17/2019   Lab Results  Component Value  Date   CREATININE 1.09 07/04/2024   BUN 16 07/04/2024   NA 141 07/04/2024   K 4.3 07/04/2024   CL 103 07/04/2024   CO2 25 07/04/2024   Lab Results  Component Value Date   ALT 34 07/04/2024   AST 23 07/04/2024   ALKPHOS 35 (L) 07/04/2024   BILITOT 0.4 07/04/2024   No results found for: CHOL, HDL, LDLCALC, LDLDIRECT, TRIG, CHOLHDL  No results found for: HGBA1C  Assessment & Plan   1. Chest pain/cardiac murmur/nonspecific abnormal EKG: Echocardiogram in 2021 showed EF 60 to 65%, normal LV function, no RWMA, normal RV systolic function, no significant valvular abnormalities.  He reports an episode of exertional chest discomfort that occurred in the setting of significant stress while at work.  He denies any recurrent chest pain.  Was recently told he had a heart murmur.  No murmur appreciated on exam today.  Recent EKG showed sinus bradycardia, 57 bpm, borderline LAD, incomplete bundle branch block, nonspecific ST/T wave changes.  Through shared decision making, and for further risk stratification will pursue coronary CT angiogram.  Will check echocardiogram.  Will update CMET.  He has a history of intolerance to metoprolol .  He will take ivabradine  prior to CT.  LDL was 82 in 06/2024.  Pending coronary CT angiogram results, consider need for lipid-lowering therapy, will defer for now.  2. Palpitations/PACs: Cardiac monitor in 01/2020 revealed predominantly normal sinus rhythm, 5.4% PAC burden, rare PACs.  No longer on diltiazem .  He reports occasional palpitations in the setting of stress, denies any associated symptoms.  Continue to monitor symptoms.  3. Prediabetes: A1c was 5.4 in 06/2024.  Monitor managed per PCP.  4. Disposition: Follow-up in 6 weeks with APP.  He will reestablish with Dr. Barbaraann as needed, pending test results.    Damien JAYSON Braver, NP 07/07/2024, 1:04 PM       [1]  Allergies Allergen Reactions   Bee Venom Anaphylaxis   Metoprolol     "

## 2024-07-05 LAB — COMPREHENSIVE METABOLIC PANEL WITH GFR
ALT: 34 IU/L (ref 0–44)
AST: 23 IU/L (ref 0–40)
Albumin: 4.8 g/dL (ref 3.8–4.9)
Alkaline Phosphatase: 35 IU/L — ABNORMAL LOW (ref 47–123)
BUN/Creatinine Ratio: 15 (ref 9–20)
BUN: 16 mg/dL (ref 6–24)
Bilirubin Total: 0.4 mg/dL (ref 0.0–1.2)
CO2: 25 mmol/L (ref 20–29)
Calcium: 9.2 mg/dL (ref 8.7–10.2)
Chloride: 103 mmol/L (ref 96–106)
Creatinine, Ser: 1.09 mg/dL (ref 0.76–1.27)
Globulin, Total: 2.7 g/dL (ref 1.5–4.5)
Glucose: 86 mg/dL (ref 70–99)
Potassium: 4.3 mmol/L (ref 3.5–5.2)
Sodium: 141 mmol/L (ref 134–144)
Total Protein: 7.5 g/dL (ref 6.0–8.5)
eGFR: 82 mL/min/1.73

## 2024-07-07 ENCOUNTER — Telehealth: Payer: Self-pay | Admitting: *Deleted

## 2024-07-07 ENCOUNTER — Encounter: Payer: Self-pay | Admitting: *Deleted

## 2024-07-07 ENCOUNTER — Encounter: Payer: Self-pay | Admitting: Nurse Practitioner

## 2024-07-07 NOTE — Telephone Encounter (Signed)
-----   Message from Veronica A sent at 07/07/2024 12:05 PM EST ----- Good afternoon,  Patients Medicaid insurance plan is out of state. He would have to complete the CTA at a facility in [place]  for insurance to cover.  Thank you.

## 2024-07-07 NOTE — Telephone Encounter (Signed)
 Call placed to pt regarding message below.  Left him a message to call me back or respond to me via mychart message.

## 2024-07-10 NOTE — Telephone Encounter (Signed)
 Informed pt that his insurance would not cover the testing outside of the state of [place]. Pt asking how much it would be out of pocket. Informed him I would forward his question to billing.

## 2024-07-10 NOTE — Telephone Encounter (Signed)
 Patient is returning your call. Please advise. ?

## 2024-07-10 NOTE — Telephone Encounter (Signed)
 Patient returned staff call.

## 2024-07-10 NOTE — Telephone Encounter (Signed)
 2nd attempt to reach pt regarding message below, left another message for pt to call back.

## 2024-07-10 NOTE — Telephone Encounter (Signed)
 Returned call back to pt, left another message.

## 2024-07-15 ENCOUNTER — Ambulatory Visit: Payer: Self-pay | Admitting: Nurse Practitioner

## 2024-07-16 ENCOUNTER — Ambulatory Visit (HOSPITAL_COMMUNITY)

## 2024-08-05 ENCOUNTER — Ambulatory Visit (HOSPITAL_COMMUNITY): Payer: Self-pay

## 2024-08-19 ENCOUNTER — Ambulatory Visit: Admitting: Nurse Practitioner
